# Patient Record
Sex: Female | Born: 1965 | Race: White | Hispanic: No | Marital: Married
Health system: Southern US, Community
[De-identification: ages and names within clinical notes are randomized; demographics above are authoritative.]

---

## 1998-01-15 ENCOUNTER — Other Ambulatory Visit: Admission: RE | Admit: 1998-01-15 | Discharge: 1998-01-15 | Payer: Self-pay | Admitting: Obstetrics and Gynecology

## 1998-08-03 ENCOUNTER — Inpatient Hospital Stay (HOSPITAL_COMMUNITY): Admission: AD | Admit: 1998-08-03 | Discharge: 1998-08-05 | Payer: Self-pay | Admitting: Obstetrics and Gynecology

## 1998-09-05 ENCOUNTER — Other Ambulatory Visit: Admission: RE | Admit: 1998-09-05 | Discharge: 1998-09-05 | Payer: Self-pay | Admitting: Obstetrics and Gynecology

## 1999-12-04 ENCOUNTER — Other Ambulatory Visit: Admission: RE | Admit: 1999-12-04 | Discharge: 1999-12-04 | Payer: Self-pay | Admitting: Obstetrics and Gynecology

## 2000-12-30 ENCOUNTER — Other Ambulatory Visit: Admission: RE | Admit: 2000-12-30 | Discharge: 2000-12-30 | Payer: Self-pay | Admitting: Obstetrics and Gynecology

## 2001-12-31 ENCOUNTER — Other Ambulatory Visit: Admission: RE | Admit: 2001-12-31 | Discharge: 2001-12-31 | Payer: Self-pay | Admitting: Obstetrics and Gynecology

## 2003-03-08 ENCOUNTER — Other Ambulatory Visit: Admission: RE | Admit: 2003-03-08 | Discharge: 2003-03-08 | Payer: Self-pay | Admitting: Obstetrics and Gynecology

## 2004-04-11 ENCOUNTER — Ambulatory Visit: Payer: Self-pay | Admitting: Family Medicine

## 2004-06-13 ENCOUNTER — Other Ambulatory Visit: Admission: RE | Admit: 2004-06-13 | Discharge: 2004-06-13 | Payer: Self-pay | Admitting: Obstetrics and Gynecology

## 2005-02-06 ENCOUNTER — Encounter: Admission: RE | Admit: 2005-02-06 | Discharge: 2005-02-06 | Payer: Self-pay | Admitting: Obstetrics and Gynecology

## 2005-03-26 ENCOUNTER — Other Ambulatory Visit: Admission: RE | Admit: 2005-03-26 | Discharge: 2005-03-26 | Payer: Self-pay | Admitting: Obstetrics and Gynecology

## 2006-05-19 ENCOUNTER — Encounter: Admission: RE | Admit: 2006-05-19 | Discharge: 2006-05-19 | Payer: Self-pay | Admitting: Obstetrics and Gynecology

## 2008-08-24 ENCOUNTER — Encounter: Admission: RE | Admit: 2008-08-24 | Discharge: 2008-08-24 | Payer: Self-pay | Admitting: Obstetrics and Gynecology

## 2008-09-05 ENCOUNTER — Ambulatory Visit (HOSPITAL_COMMUNITY): Admission: RE | Admit: 2008-09-05 | Discharge: 2008-09-05 | Payer: Self-pay | Admitting: Obstetrics and Gynecology

## 2009-09-03 ENCOUNTER — Encounter: Admission: RE | Admit: 2009-09-03 | Discharge: 2009-09-03 | Payer: Self-pay | Admitting: Obstetrics and Gynecology

## 2010-06-17 LAB — CBC
HCT: 34.5 % — ABNORMAL LOW (ref 36.0–46.0)
Hemoglobin: 12.3 g/dL (ref 12.0–15.0)
MCHC: 35.5 g/dL (ref 30.0–36.0)
MCV: 93.4 fL (ref 78.0–100.0)
Platelets: 222 10*3/uL (ref 150–400)
RBC: 3.7 MIL/uL — ABNORMAL LOW (ref 3.87–5.11)
RDW: 12.8 % (ref 11.5–15.5)
WBC: 4.9 10*3/uL (ref 4.0–10.5)

## 2010-06-17 LAB — PREGNANCY, URINE: Preg Test, Ur: NEGATIVE

## 2010-07-23 NOTE — Op Note (Signed)
NAME:  Sherri Rodriguez, Sherri Rodriguez           ACCOUNT NO.:  [id number]   MEDICAL RECORD NO.:  [id number]          PATIENT TYPE:  AMB   LOCATION:  SDC                           FACILITY:  [facility]   PHYSICIAN:  Zenaida Niece, M.D.DATE OF BIRTH:  12-11-1965   DATE OF PROCEDURE:  09/05/2008  DATE OF DISCHARGE:                               OPERATIVE REPORT   PREOPERATIVE DIAGNOSIS:  Dyspareunia.   POSTOPERATIVE DIAGNOSIS:  Dyspareunia.   PROCEDURE:  Perineoplasty.   SURGEON:  Zenaida Niece, MD   ANESTHESIA:  Local.   FINDINGS:  She had a narrow introitus.   SPECIMENS:  None.   ESTIMATED BLOOD LOSS:  Minimal.   COMPLICATIONS:  None.   PROCEDURE IN DETAIL:  The patient was taken into the operating room and  placed in the dorsal supine position.  She was then placed in mobile  stirrups.  Perineum and outer vagina were prepped and draped in the  usual sterile fashion.  A 0.5% Marcaine with epinephrine was infiltrated  at the posterior introitus.  Once this had taken effect, the hymenal  ring was grasped with Allis clamps just to the side of the midline.  A  vertical incision was then made with a scalpel that went approximately 1  cm inside the vagina and 1 cm outside the vagina.  This appeared to  achieve good opening of the introitus once this was approximated  horizontally.  This defect was then sutured horizontally with running  locking 3-0 Vicryl.  This achieved good closure and adequate hemostasis.  This achieved an extra 1-2 cm of length in the introitus.  The patient  tolerated the procedure well.  She was taken down from the stirrups and  taken to the recovery room in stable condition.  All counts were correct  and she received Ancef 1 g IV at the beginning of the procedure.      Zenaida Niece, M.D.  Electronically Signed     TDM/MEDQ  D:  09/05/2008  T:  09/05/2008  Job:  [id number]

## 2010-09-30 ENCOUNTER — Other Ambulatory Visit: Payer: Self-pay | Admitting: Obstetrics and Gynecology

## 2010-09-30 DIAGNOSIS — Z1231 Encounter for screening mammogram for malignant neoplasm of breast: Secondary | ICD-10-CM

## 2010-10-08 ENCOUNTER — Ambulatory Visit
Admission: RE | Admit: 2010-10-08 | Discharge: 2010-10-08 | Disposition: A | Discharge status: Home / Self Care | Payer: 59 | Source: Ambulatory Visit | Attending: Obstetrics and Gynecology | Admitting: Obstetrics and Gynecology

## 2010-10-08 DIAGNOSIS — Z1231 Encounter for screening mammogram for malignant neoplasm of breast: Secondary | ICD-10-CM

## 2011-11-18 ENCOUNTER — Other Ambulatory Visit: Payer: Self-pay | Admitting: Obstetrics and Gynecology

## 2011-11-18 DIAGNOSIS — Z1231 Encounter for screening mammogram for malignant neoplasm of breast: Secondary | ICD-10-CM

## 2011-12-01 ENCOUNTER — Ambulatory Visit: Payer: 59

## 2011-12-15 ENCOUNTER — Ambulatory Visit
Admission: RE | Admit: 2011-12-15 | Discharge: 2011-12-15 | Disposition: A | Discharge status: Home / Self Care | Payer: 59 | Source: Ambulatory Visit | Attending: Obstetrics and Gynecology | Admitting: Obstetrics and Gynecology

## 2011-12-15 DIAGNOSIS — Z1231 Encounter for screening mammogram for malignant neoplasm of breast: Secondary | ICD-10-CM

## 2013-01-18 ENCOUNTER — Other Ambulatory Visit: Payer: Self-pay

## 2013-01-18 DIAGNOSIS — Z1231 Encounter for screening mammogram for malignant neoplasm of breast: Secondary | ICD-10-CM

## 2013-02-18 ENCOUNTER — Ambulatory Visit
Admission: RE | Admit: 2013-02-18 | Discharge: 2013-02-18 | Disposition: A | Discharge status: Home / Self Care | Payer: 59 | Source: Ambulatory Visit

## 2013-02-18 DIAGNOSIS — Z1231 Encounter for screening mammogram for malignant neoplasm of breast: Secondary | ICD-10-CM

## 2014-04-11 ENCOUNTER — Other Ambulatory Visit: Payer: Self-pay

## 2014-04-11 DIAGNOSIS — Z1231 Encounter for screening mammogram for malignant neoplasm of breast: Secondary | ICD-10-CM

## 2014-04-18 ENCOUNTER — Ambulatory Visit
Admission: RE | Admit: 2014-04-18 | Discharge: 2014-04-18 | Disposition: A | Discharge status: Home / Self Care | Payer: 59 | Source: Ambulatory Visit

## 2014-04-18 ENCOUNTER — Encounter (INDEPENDENT_AMBULATORY_CARE_PROVIDER_SITE_OTHER): Payer: Self-pay

## 2014-04-18 DIAGNOSIS — Z1231 Encounter for screening mammogram for malignant neoplasm of breast: Secondary | ICD-10-CM

## 2015-04-02 ENCOUNTER — Other Ambulatory Visit: Payer: Self-pay

## 2015-04-02 DIAGNOSIS — Z1231 Encounter for screening mammogram for malignant neoplasm of breast: Secondary | ICD-10-CM

## 2015-04-26 ENCOUNTER — Ambulatory Visit
Admission: RE | Admit: 2015-04-26 | Discharge: 2015-04-26 | Disposition: A | Discharge status: Home / Self Care | Payer: Commercial Managed Care - HMO | Source: Ambulatory Visit

## 2015-04-26 DIAGNOSIS — Z1231 Encounter for screening mammogram for malignant neoplasm of breast: Secondary | ICD-10-CM

## 2015-05-31 ENCOUNTER — Other Ambulatory Visit: Payer: Self-pay | Admitting: Family Medicine

## 2015-05-31 DIAGNOSIS — R634 Abnormal weight loss: Secondary | ICD-10-CM

## 2015-06-06 ENCOUNTER — Ambulatory Visit
Admission: RE | Admit: 2015-06-06 | Discharge: 2015-06-06 | Disposition: A | Discharge status: Home / Self Care | Payer: Commercial Managed Care - HMO | Source: Ambulatory Visit | Attending: Family Medicine | Admitting: Family Medicine

## 2015-06-06 DIAGNOSIS — R634 Abnormal weight loss: Secondary | ICD-10-CM

## 2015-06-06 IMAGING — CT CT ABD-PELV W/ CM
2 of 5 series · 16 of 46 positions shown, 18 images · IV contrast (APPLIED)
Comparison: None.

CLINICAL DATA: Abnormal weight loss over the past 2 months.

EXAM:
CT ABDOMEN AND PELVIS WITH CONTRAST
TECHNIQUE: Multidetector CT imaging of the abdomen and pelvis was performed
using the standard protocol following bolus administration of
intravenous contrast.
CONTRAST:  100mL [IW] IOPAMIDOL ([IW]) INJECTION 61%

[Series 2: abd/pelvis w/cm · axial · 0.59mm/px · z∈[+718,+1063]mm · 13 of 79 slices shown, 15 images]
[im 5/79  soft-tissue]
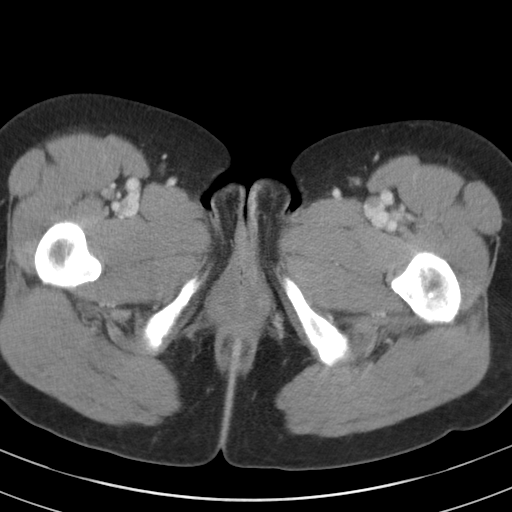
[im 5/79  bone]
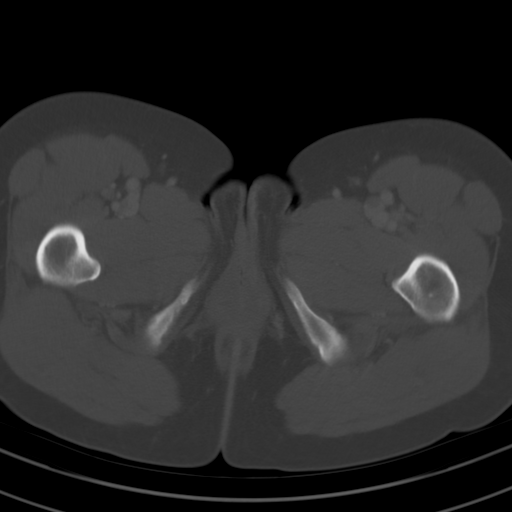
[im 13/79  soft-tissue]
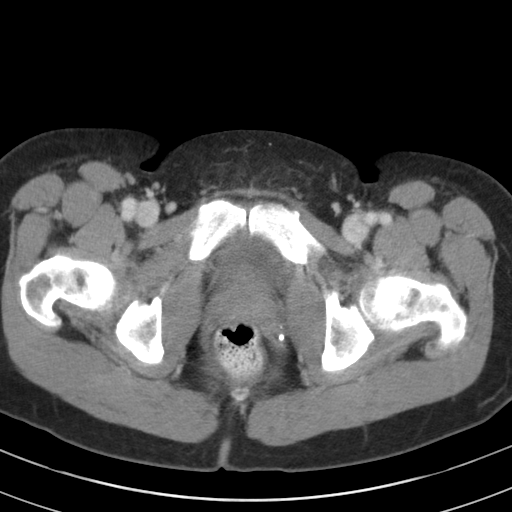
[im 17/79  soft-tissue]
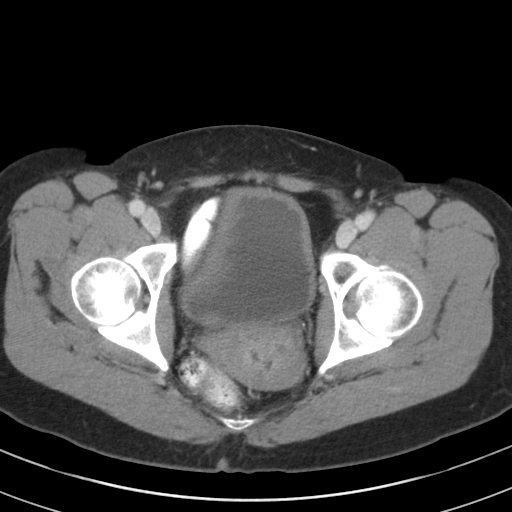
[im 21/79  soft-tissue]
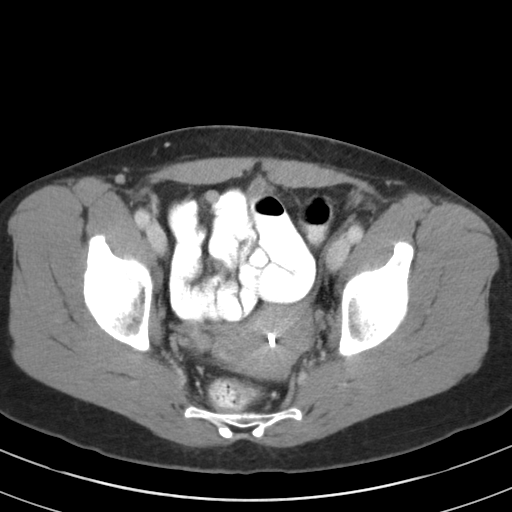
[im 29/79  soft-tissue]
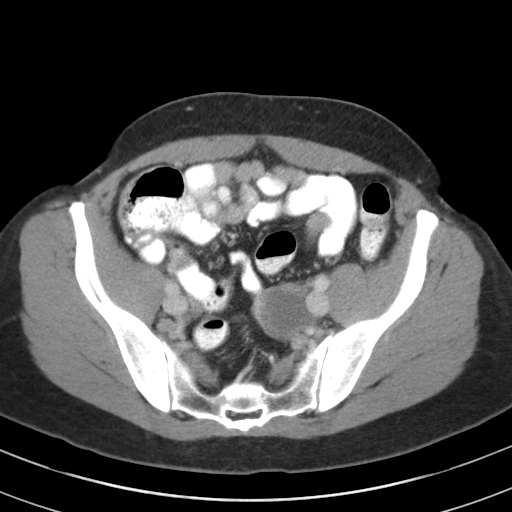
[im 33/79  soft-tissue]
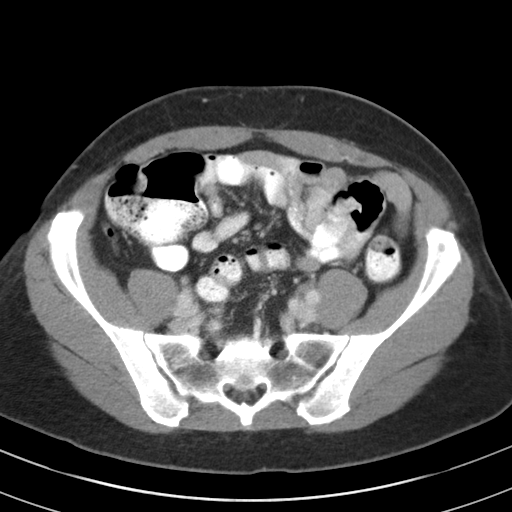
[im 42/79  soft-tissue]
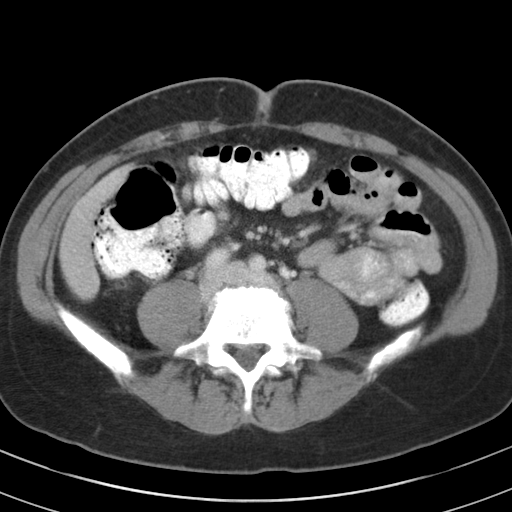
[im 46/79  soft-tissue]
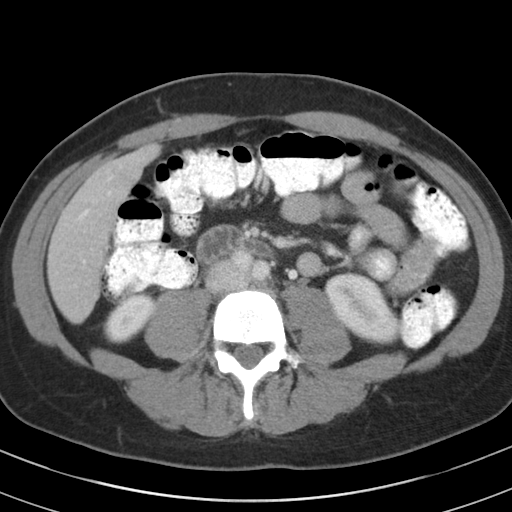
[im 50/79  soft-tissue]
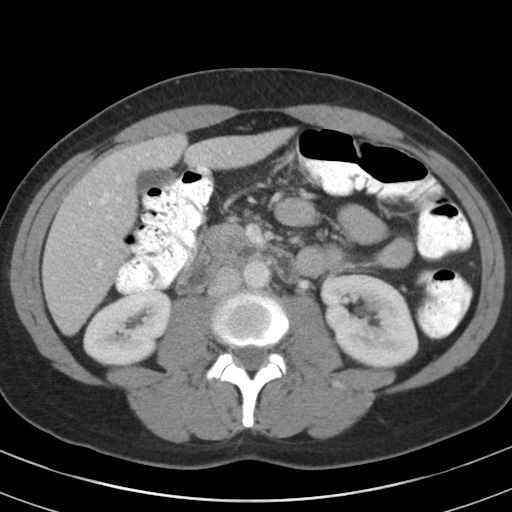
[im 50/79  bone]
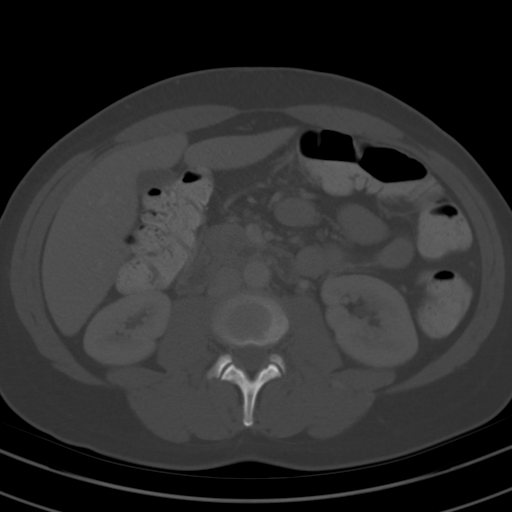
[im 58/79  soft-tissue]
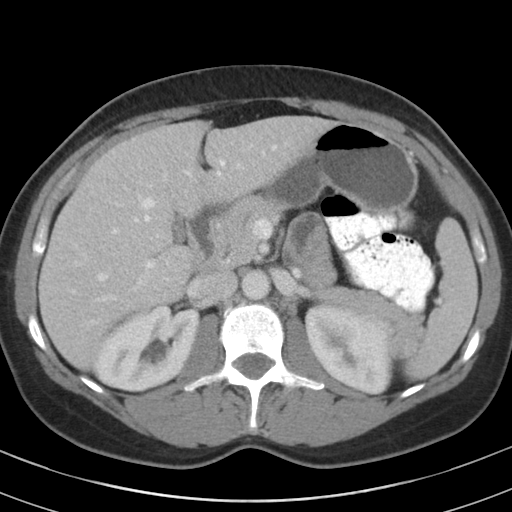
[im 62/79  soft-tissue]
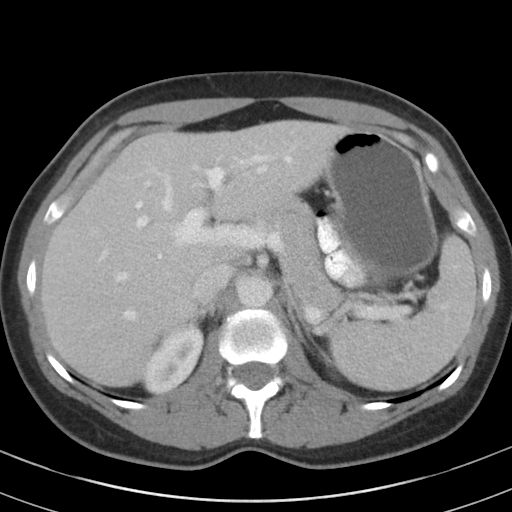
[im 66/79  soft-tissue]
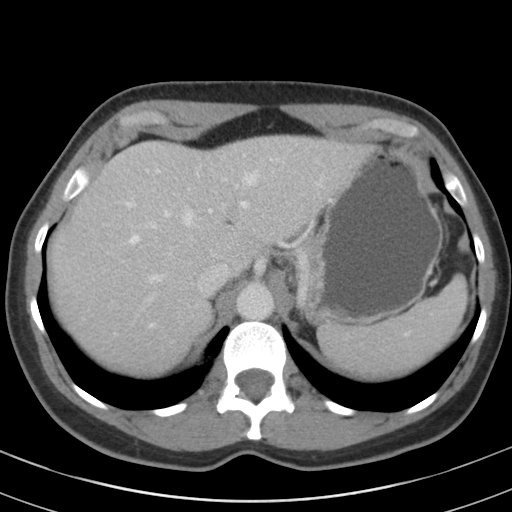
[im 74/79  soft-tissue]
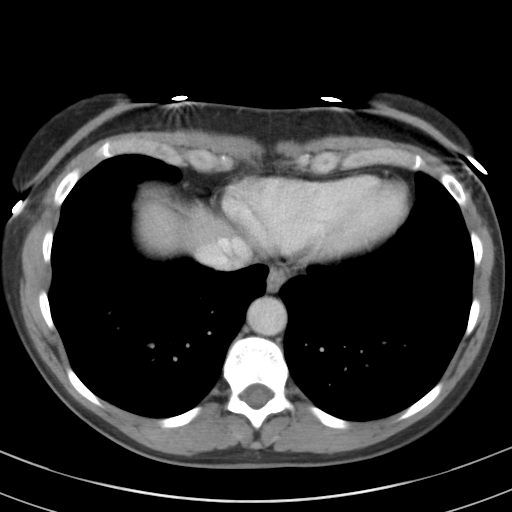

[Series 3: cor · coronal · 0.57mm/px · 3 of 81 slices shown]
[im 27/81  soft-tissue]
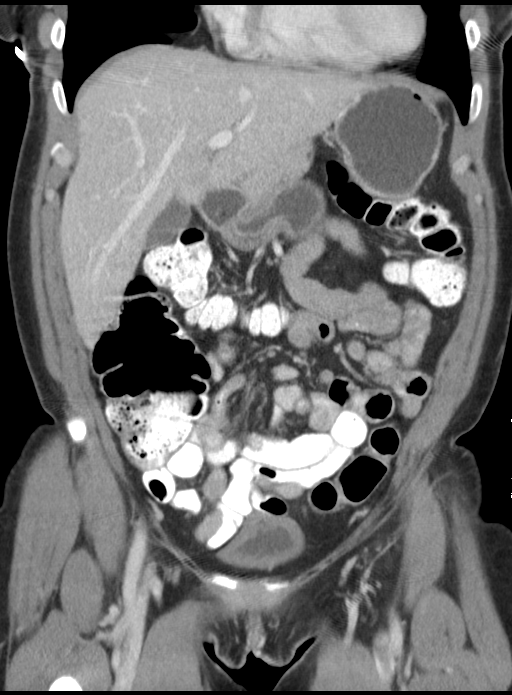
[im 36/81  soft-tissue]
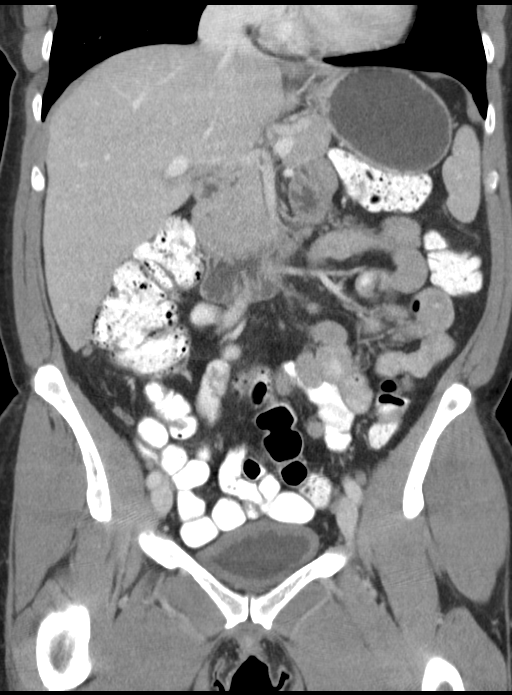
[im 45/81  soft-tissue]
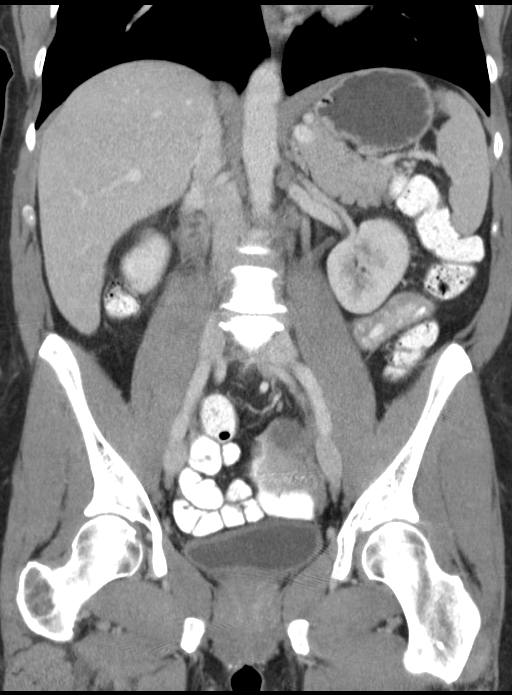

[16 of 46 positions shown; findings below may reference images not displayed]

FINDINGS: Lower chest: The lung bases are clear of acute process. No pleural
effusion or pulmonary lesions. The heart is normal in size. No
pericardial effusion. Pectus deformity slight flattening of the
heart. The distal esophagus and aorta are unremarkable.

Hepatobiliary: No focal hepatic lesions or intrahepatic biliary
dilatation. The gallbladder is normal. No common bile duct
dilatation.

Pancreas: No mass, inflammation or ductal dilatation.

Spleen: Normal size.  No focal lesions.

Adrenals/Urinary Tract: The adrenal glands and kidneys are normal.

Stomach/Bowel: The stomach, duodenum, small bowel and colon are
normal. No inflammatory changes, mass lesions or obstructive
findings. The terminal ileum is normal. The appendix is normal.

Vascular/Lymphatic: No mesenteric or retroperitoneal mass are
adenopathy. The aorta is normal in caliber. The branch vessels are
normal. The major venous structures are patent.

Reproductive: The uterus and ovaries are normal except for several
nabothian cysts at the cervix. An IUD is noted in the endometrial
canal. Simple appearing left ovarian cyst.

Other: Mild bladder wall thickening likely due to lack of
distention. Cystitis is also possible. No pelvic mass or adenopathy.
No free pelvic fluid collections. No inguinal mass or adenopathy. No
abdominal wall hernia or subcutaneous lesions.

Musculoskeletal: No significant bony findings. Moderate degenerative
disc disease at L4-5.
IMPRESSION: No acute abdominal/pelvic findings, mass lesions or lymphadenopathy.

## 2015-06-06 MED ORDER — IOPAMIDOL (ISOVUE-300) INJECTION 61%
100.0000 mL | Freq: Once | INTRAVENOUS | Status: AC | PRN
  Administered 2015-06-06: 100 mL via INTRAVENOUS

## 2015-08-27 ENCOUNTER — Other Ambulatory Visit: Payer: Self-pay | Admitting: Physician Assistant

## 2015-08-27 DIAGNOSIS — R0989 Other specified symptoms and signs involving the circulatory and respiratory systems: Secondary | ICD-10-CM

## 2015-08-27 DIAGNOSIS — R1313 Dysphagia, pharyngeal phase: Secondary | ICD-10-CM

## 2015-08-31 ENCOUNTER — Ambulatory Visit
Admission: RE | Admit: 2015-08-31 | Discharge: 2015-08-31 | Disposition: A | Discharge status: Home / Self Care | Payer: Commercial Managed Care - HMO | Source: Ambulatory Visit | Attending: Physician Assistant | Admitting: Physician Assistant

## 2015-08-31 DIAGNOSIS — R1313 Dysphagia, pharyngeal phase: Secondary | ICD-10-CM

## 2015-08-31 DIAGNOSIS — R0989 Other specified symptoms and signs involving the circulatory and respiratory systems: Secondary | ICD-10-CM

## 2015-08-31 IMAGING — RF DG ESOPHAGUS
19 of 24 series · 19 of 24 positions shown · non-contrast
Comparison: None.

CLINICAL DATA: Globus sensation with pharyngeal dysphagia

EXAM:
ESOPHOGRAM / BARIUM SWALLOW / BARIUM TABLET STUDY
TECHNIQUE: Combined double contrast and single contrast examination performed
using effervescent crystals, thick barium liquid, and thin barium
liquid. The patient was observed with fluoroscopy swallowing a 13 mm
barium sulphate tablet.
FLUOROSCOPY TIME:  Radiation Exposure Index (as provided by the
fluoroscopic device):
If the device does not provide the exposure index:
Fluoroscopy Time:  1 minutes and 42 seconds.
Number of Acquired Images:

[Series 1: run · 1 of 8 slices shown (1 of 19)]
[im 1/8]
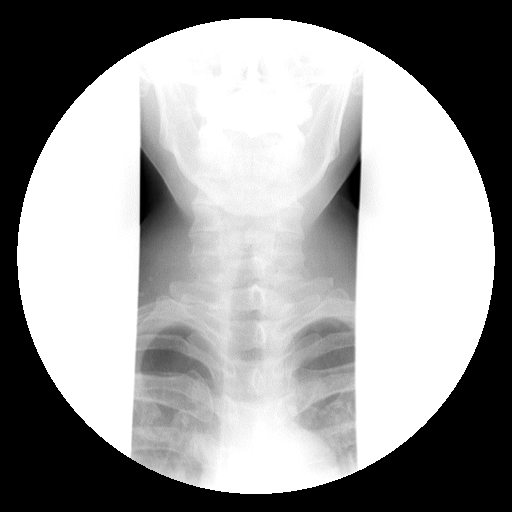

[Series 2: run · 1 of 8 slices shown (2 of 19)]
[im 1/8]
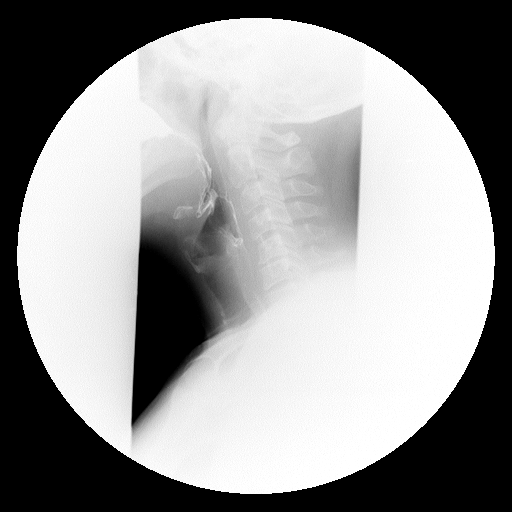

[Series 4: run · 1 of 1 slices shown (3 of 19)]
[im 1/1]
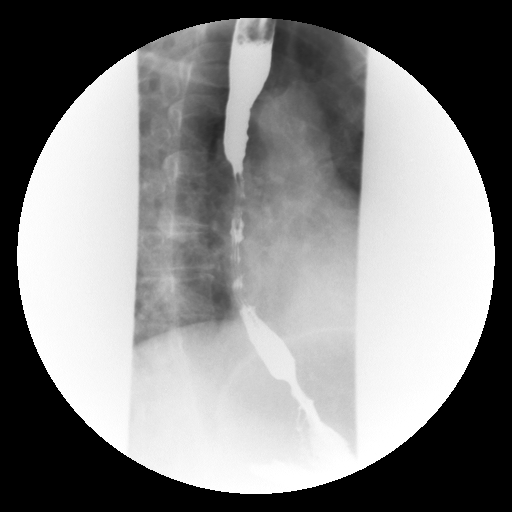

[Series 5: run · 1 of 1 slices shown (4 of 19)]
[im 1/1]
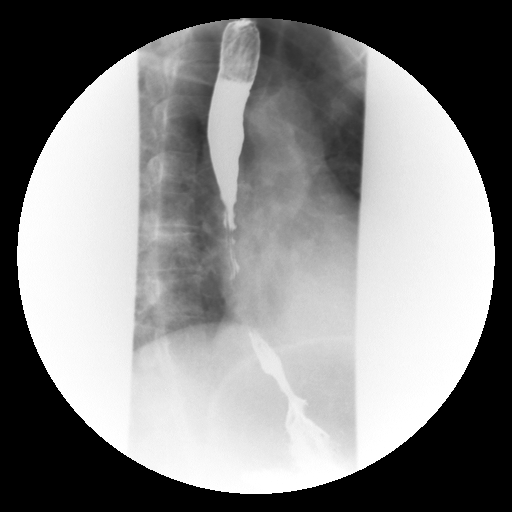

[Series 6: run · 1 of 1 slices shown (5 of 19)]
[im 1/1]
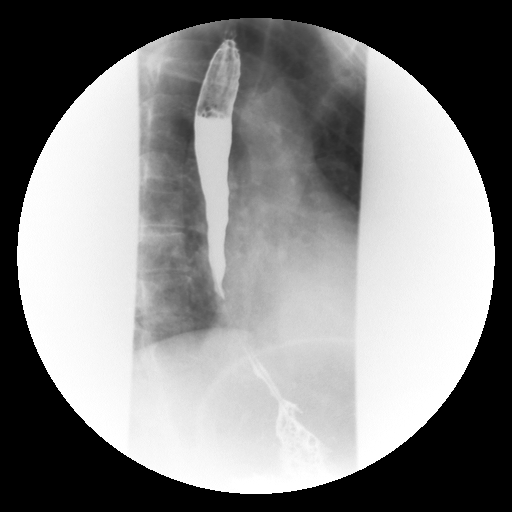

[Series 7: run · 1 of 1 slices shown (6 of 19)]
[im 1/1]
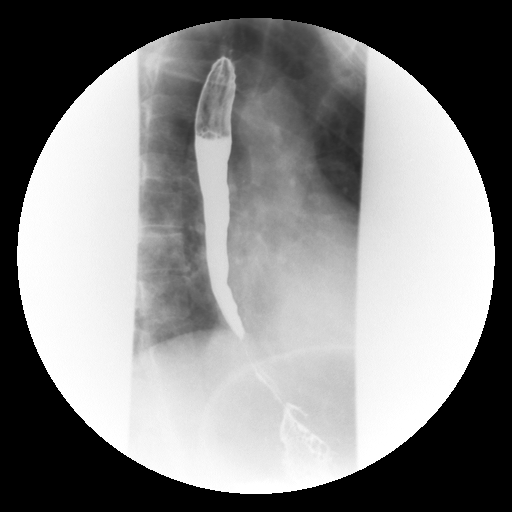

[Series 9: run · 1 of 1 slices shown (7 of 19)]
[im 1/1]
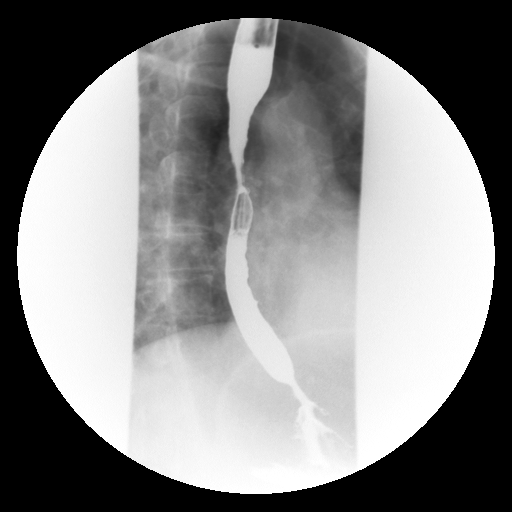

[Series 10: run · 1 of 1 slices shown (8 of 19)]
[im 1/1]
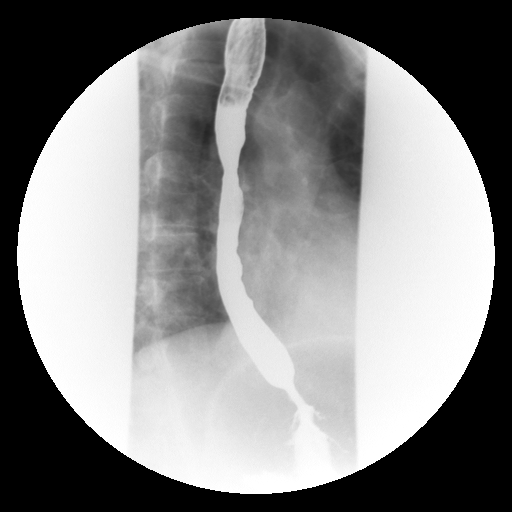

[Series 11: run · 1 of 1 slices shown (9 of 19)]
[im 1/1]
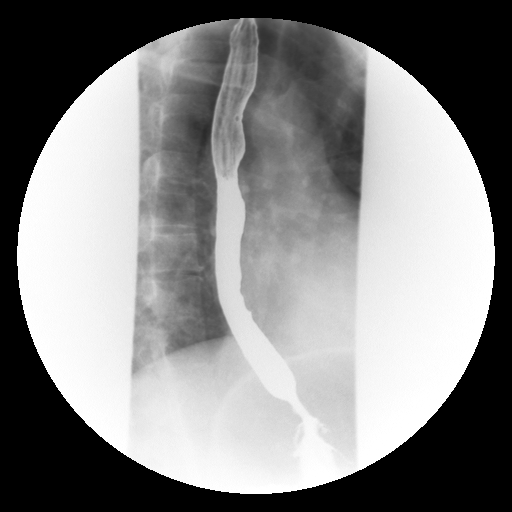

[Series 13: run · 1 of 1 slices shown (10 of 19)]
[im 1/1]
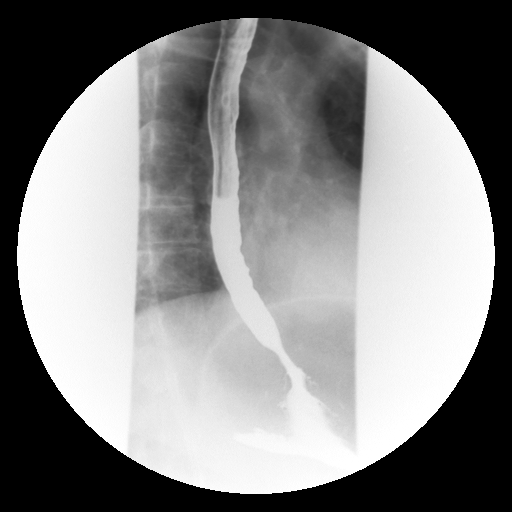

[Series 14: run · 1 of 1 slices shown (11 of 19)]
[im 1/1]
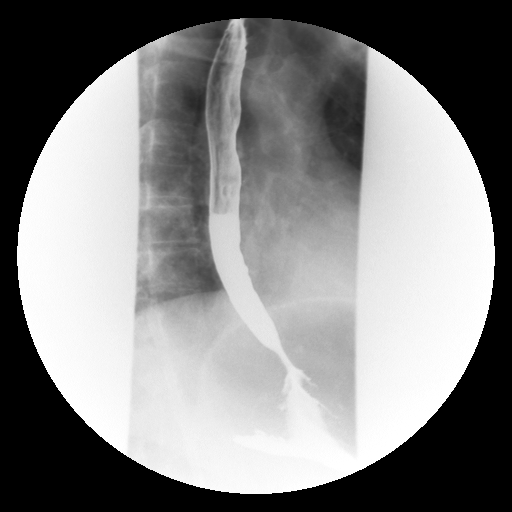

[Series 15: run · 1 of 1 slices shown (12 of 19)]
[im 1/1]
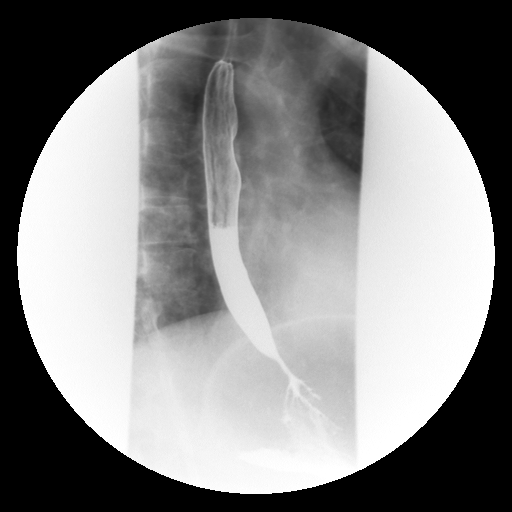

[Series 16: run · 1 of 1 slices shown (13 of 19)]
[im 1/1]
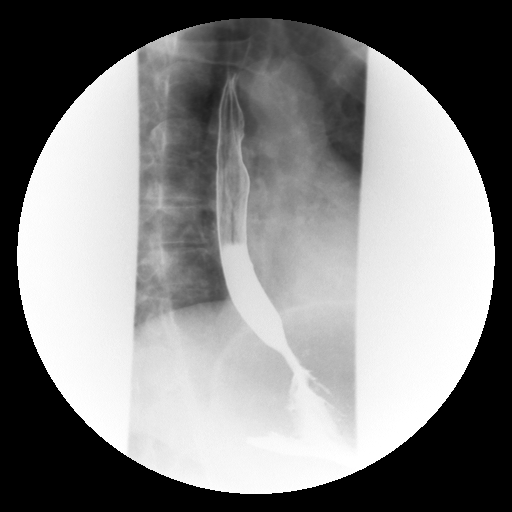

[Series 18: run · 1 of 3 slices shown (14 of 19)]
[im 1/3]
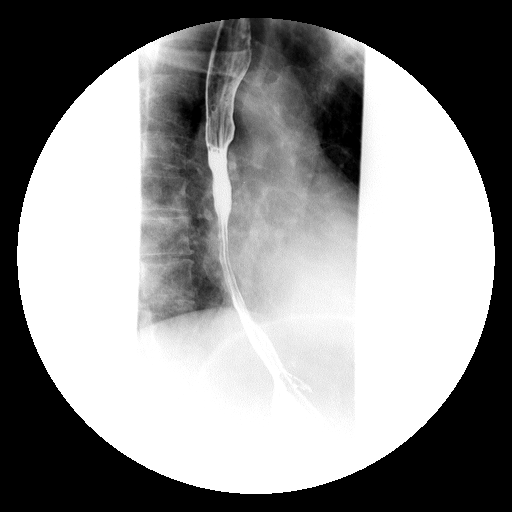

[Series 19: run · 1 of 1 slices shown (15 of 19)]
[im 1/1]
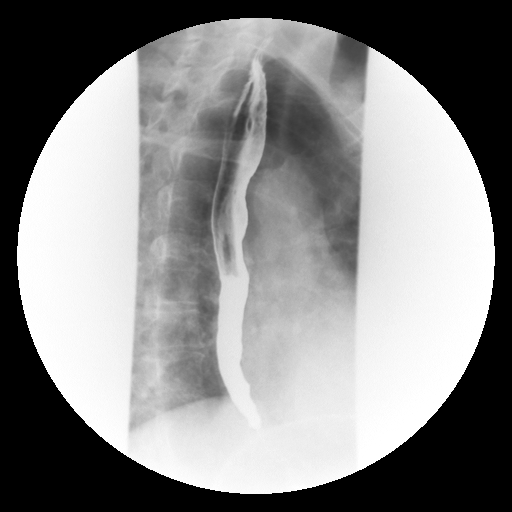

[Series 20: run · 1 of 5 slices shown (16 of 19)]
[im 1/5]
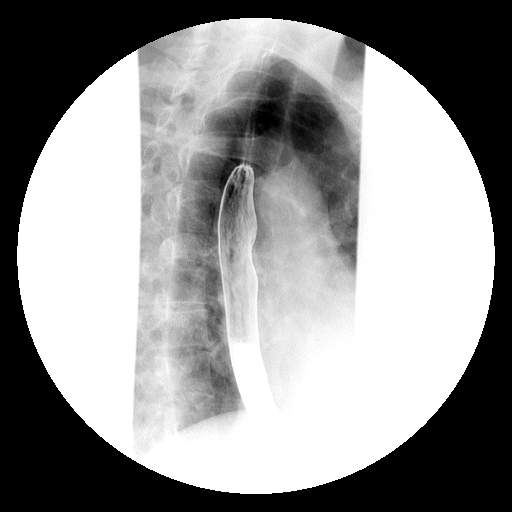

[Series 21: run · 1 of 1 slices shown (17 of 19)]
[im 1/1]
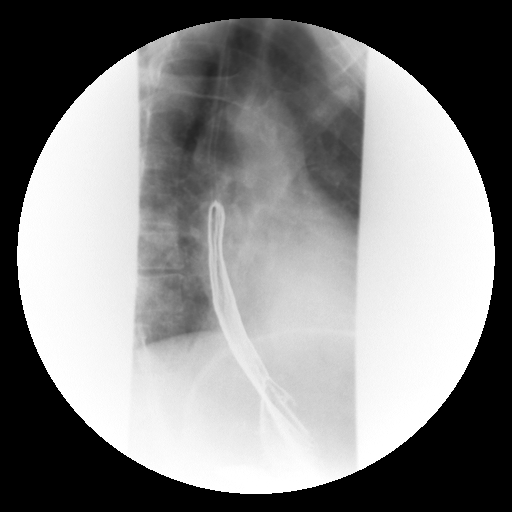

[Series 23: run · 1 of 1 slices shown (18 of 19)]
[im 1/1]
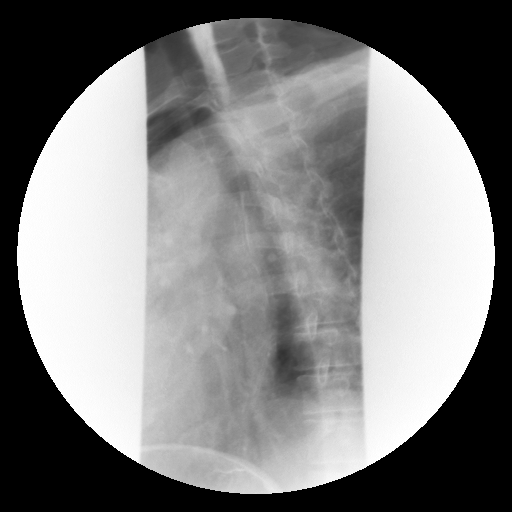

[Series 24: run · 1 of 1 slices shown (19 of 19)]
[im 1/1]
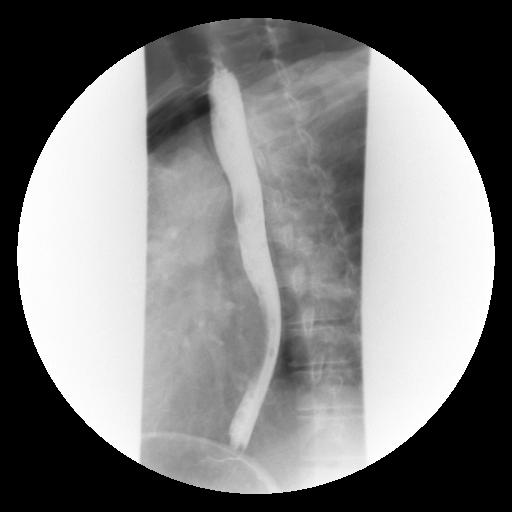

[19 of 24 positions shown; findings below may reference images not displayed]

FINDINGS: Frontal and lateral views of the hypopharynx while swallowing are
normal.

Double contrast imaging of the esophagus shows no evidence for
esophageal stricture or mass lesion. No esophageal diverticulum. No
evidence for mucosal ulceration.

Esophageal motility is normal with good preservation of primary
peristalsis on all swallows. Mucosal relief view shows no evidence
for esophageal fold thickening is suggest esophagitis.

13 mm barium tablet passes readily into the stomach when taken with
water.
IMPRESSION: Normal double contrast barium esophagram.

## 2018-12-22 ENCOUNTER — Other Ambulatory Visit: Payer: Self-pay

## 2018-12-22 DIAGNOSIS — Z20822 Contact with and (suspected) exposure to covid-19: Secondary | ICD-10-CM

## 2018-12-23 LAB — NOVEL CORONAVIRUS, NAA: SARS-CoV-2, NAA: NOT DETECTED

## 2019-03-10 ENCOUNTER — Other Ambulatory Visit: Payer: Self-pay

## 2019-03-10 ENCOUNTER — Encounter: Payer: Self-pay | Admitting: Physical Therapy

## 2019-03-10 ENCOUNTER — Ambulatory Visit: Payer: Commercial Managed Care - PPO | Attending: Family Medicine | Admitting: Physical Therapy

## 2019-03-10 DIAGNOSIS — M25652 Stiffness of left hip, not elsewhere classified: Secondary | ICD-10-CM | POA: Diagnosis present

## 2019-03-10 DIAGNOSIS — M25552 Pain in left hip: Secondary | ICD-10-CM | POA: Diagnosis not present

## 2019-03-10 NOTE — Therapy (Signed)
[facility] [address], [place], [place], [address] Phone: [phone]   Fax:  [phone]  Physical Therapy Evaluation  Patient Details  Name: Sherri Rodriguez MRN: [id number] Date of Birth: 1965-09-23 Referring Provider (PT): Jillyn Ledger, FNP   Encounter Date: 03/10/2019  PT End of Session - 03/10/19 1154    Visit Number  1    Date for PT Re-Evaluation  04/22/19    Authorization Type  [employer] PPO    Authorization Time Period  03/10/19 to 04/22/19    PT Start Time  1016    PT Stop Time  1058    PT Time Calculation (min)  42 min    Activity Tolerance  No increased pain;Patient tolerated treatment well    Behavior During Therapy  [facility] for tasks assessed/performed       History reviewed. No pertinent past medical history.  History reviewed. No pertinent surgical history.  There were no vitals filed for this visit.   Subjective Assessment - 03/10/19 1020    Subjective  Pt states that she went skydiving at the end of November, but noticed a tweak in her Lt anterior hip while playing pickleball the following Wednesday. She has noticed it becoming increasingly worse over the past weeks, and she is now noticing pain daily.    Pertinent History  none    Diagnostic tests  Xray: negative for pathology    Patient Stated Goals  decrease pain with activity    Currently in Pain?  Yes    Pain Score  2     Pain Location  Hip    Pain Orientation  Left;Anterior    Pain Descriptors / Indicators  Aching    Pain Type  Acute pain    Pain Radiating Towards  sometimes pain lateral thigh    Pain Onset  1 to 4 weeks ago    Pain Frequency  Intermittent    Aggravating Factors   Lt hip flexion, playing pickleball/tennis towards the end    Pain Relieving Factors  tighening lower abs helps take stress off of it         [facility] PT Assessment - 03/10/19 0001      Assessment   Medical Diagnosis  Pain in left hip     Referring  Provider (PT)  Jillyn Ledger, FNP    Onset Date/Surgical Date  --   Feb 09, 2019   Next MD Visit  none     Prior Therapy  none       Precautions   Precautions  None      Restrictions   Weight Bearing Restrictions  No      Balance Screen   Has the patient fallen in the past 6 months  No    Has the patient had a decrease in activity level because of a fear of falling?   No    Is the patient reluctant to leave their home because of a fear of falling?   No      Prior Function   Leisure  plays pickleball/tennis throughout the week; recently began a couch to 5k       Cognition   Overall Cognitive Status  Within Functional Limits for tasks assessed      Sensation   Additional Comments  denies numbness/tingling       Functional Tests   Functional tests  Single Leg Squat;Squat;Single leg stance      Squat   Comments  BLE:  weight shift Rt (+) pain Lt anterior hip      Single Leg Squat   Comments  x8 reps (+) knee valgus noted on Lt       Single Leg Stance   Comments  10+ sec, no pain       ROM / Strength   AROM / PROM / Strength  Strength;AROM      AROM   Overall AROM Comments  Active lumbar flexion, rotation, extension full and pain free; Lt hip: IR 20 deg (+) pain; Rt IR 40 deg pain free       Strength   Strength Assessment Site  Hip;Knee    Right/Left Hip  Right;Left    Right Hip Flexion  5/5    Right Hip Extension  5/5    Right Hip ABduction  4/5    Left Hip Flexion  4/5   (+) pain   Left Hip Extension  4/5    Left Hip External Rotation  4/5    Left Hip Internal Rotation  4/5    Left Hip ABduction  4/5      Palpation   Palpation comment  tenderness Lt ITB, Lt iliacus                Objective measurements completed on examination: See above findings.      [facility] Adult PT Treatment/Exercise - 03/10/19 0001      Exercises   Exercises  Knee/Hip      Knee/Hip Exercises: Stretches   Other Knee/Hip Stretches  long sitting hip IR/ER knee fallout x3  reps, HEP demo              PT Education - 03/10/19 1153    Education Details  seated hip IR/ER fallout for home; trial hip flexor yoga at home    Person(s) Educated  Patient    Methods  Explanation;Verbal cues    Comprehension  Verbalized understanding;Returned demonstration       PT Short Term Goals - 03/10/19 1106      PT SHORT TERM GOAL #1   Title  Pt will have increased Lt hip passive internal rotation and external rotation greater than 30 deg without pain.    Time  3    Period  Weeks    Status  New    Target Date  03/31/19      PT SHORT TERM GOAL #2   Title  Pt will be independent and consistent with her intial HEP.    Time  6    Period  Weeks    Status  New        PT Long Term Goals - 03/10/19 1112      PT LONG TERM GOAL #1   Title  Pt will have 5/5 MMT strength of the Lt hip which will allow her to safely resume tennis/pickleball participation.    Time  6    Period  Weeks    Status  New    Target Date  04/22/19      PT LONG TERM GOAL #2   Title  Pt will report atleast 60% improvement in her pain from the start of PT.    Time  6    Period  Weeks    Status  New      PT LONG TERM GOAL #3   Title  Pt will be able to complete BLE squat x10 reps without weight shift to the Rt.    Time  6  Period  Weeks    Status  New      PT LONG TERM GOAL #4   Title  Pt will be able to complete single leg squat with UE support x5 reps without knee valgus deviation on the Lt, to reflect improved single leg stability.    Time  6    Period  Weeks    Status  New      PT LONG TERM GOAL #5   Title  Pt will be able to return to her running program without exacerbation of Lt hip pain.    Time  6    Period  Weeks    Status  New             Plan - 03/10/19 1252    Clinical Impression Statement  Pt is a 53 y.o F referred to OPPT with complaints of acute Lt anterior hip pain. She is active, playing pickleball and tennis throughout the week. She has also recently  started a running program. Pain was onset beginning of December, noted during a pickleball game several days after skydiving, and it has gradually worsened since then. Pt has palpable tenderness of the Lt TFL/iliacus and pain/weakness with resisted hip flexion. There is noted restrictions in Lt hip rotation compared to the Rt and this is evident during functional activity such as bilateral LE squatting and single leg squatting. Pt would benefit from skilled PT to address her restrictions in hip strength, flexibility and neuromuscular control to promote return to her active lifestyle without limitation.    Personal Factors and Comorbidities  Time since onset of injury/illness/exacerbation;Fitness    Examination-Activity Limitations  Squat    Examination-Participation Restrictions  Other   recreation   Stability/Clinical Decision Making  Stable/Uncomplicated    Clinical Decision Making  Low    Rehab Potential  Good    PT Frequency  Other (comment)   1-2x/week as needed   PT Duration  6 weeks    PT Treatment/Interventions  ADLs/Self Care Home Management;Cryotherapy;Moist Heat;Stair training;Neuromuscular re-education;Balance training;Therapeutic exercise;Therapeutic activities;Patient/family education;Manual techniques;Dry needling;Passive range of motion;Taping    PT Next Visit Plan  f/u on hip flexor yoga/IR-ER stretch; implement HEP: glute strength and hip rotation strength; Lt hip mobilization as needed    PT Home Exercise Plan  next visit    Consulted and Agree with Plan of Care  Patient       Patient will benefit from skilled therapeutic intervention in order to improve the following deficits and impairments:  Decreased activity tolerance, Improper body mechanics, Pain, Increased muscle spasms, Hypomobility, Decreased strength, Decreased range of motion, Impaired flexibility  Visit Diagnosis: Pain in left hip  Stiffness of left hip, not elsewhere classified     Problem List There are  no problems to display for this patient.   1:26 PM,03/10/19 Donita BrooksSara Andreya Lacks PT, DPT [facility]  [phone]  [facility] [address], [address] [place], [place], [address] Phone: [phone]   Fax:  [phone]  Name: Maretta LosSamantha G Bignell MRN: [id number] Date of Birth: [birth year]

## 2019-03-15 ENCOUNTER — Ambulatory Visit: Payer: Commercial Managed Care - PPO | Attending: Family Medicine | Admitting: Physical Therapy

## 2019-03-15 ENCOUNTER — Other Ambulatory Visit: Payer: Self-pay

## 2019-03-15 ENCOUNTER — Encounter: Payer: Self-pay | Admitting: Physical Therapy

## 2019-03-15 DIAGNOSIS — M25552 Pain in left hip: Secondary | ICD-10-CM | POA: Diagnosis present

## 2019-03-15 DIAGNOSIS — M25652 Stiffness of left hip, not elsewhere classified: Secondary | ICD-10-CM | POA: Diagnosis present

## 2019-03-15 NOTE — Patient Instructions (Signed)
Access Code: [email or web address]  URL: [email or web address]  Date: 03/15/2019  Prepared by: Donita Brooks    Exercises Side Plank on Knees- 5 reps- 2-3 sets- 10 hold- 1x daily- 7x weekly  Marching Bridge- 10 reps- 2-3 sets- 1x daily- 7x weekly  Standing Hip Extension with Resistance- 10 reps- 2-3 sets- 1x daily- 7x weekly    [facility] [address] [place], [place] [address] Phone # [phone] Fax [phone]

## 2019-03-15 NOTE — Therapy (Signed)
[facility] [address], [address] [place], [place], [address] Phone: [phone]   Fax:  [phone]  Physical Therapy Treatment  Patient Details  Name: Sherri Rodriguez MRN: [id number] Date of Birth: 09/11/1965 Referring Provider (PT): Peri Maris, FNP   Encounter Date: 03/15/2019  PT End of Session - 03/15/19 1228    Visit Number  2    Date for PT Re-Evaluation  04/22/19    Authorization Type  [employer] PPO    Authorization Time Period  03/10/19 to 04/22/19    PT Start Time  1150    PT Stop Time  1228    PT Time Calculation (min)  38 min    Activity Tolerance  No increased pain;Patient tolerated treatment well    Behavior During Therapy  [facility] for tasks assessed/performed       History reviewed. No pertinent past medical history.  History reviewed. No pertinent surgical history.  There were no vitals filed for this visit.  Subjective Assessment - 03/15/19 1400    Subjective  Pt states that her hip is still bothering her. She did work on her stretching and yoga over the week.    Pertinent History  none    Diagnostic tests  Xray: negative for pathology    Patient Stated Goals  decrease pain with activity    Currently in Pain?  No/denies    Pain Onset  1 to 4 weeks ago                       [facility] Adult PT Treatment/Exercise - 03/15/19 0001      Knee/Hip Exercises: Standing   Hip Extension  Right;Stengthening;Left;1 set;10 reps    Extension Limitations  red TB     Other Standing Knee Exercises  active hip IR/ER without overpressure with LE on 2nd step x15 reps       Knee/Hip Exercises: Supine   Animal nutritionist;Both    Bridges Limitations  hold with alternating LE march 2x8 reps each       Knee/Hip Exercises: Sidelying   Other Sidelying Knee/Hip Exercises  side plank on knees/elbow 5x5 sec hold each side       Manual Therapy   Manual therapy comments  STM Lt quadriceps, Lt TFL with trigger  point release Lt quadriceps             PT Education - 03/15/19 1228    Education Details  updated HEP; sleeping positions    Person(s) Educated  Patient    Methods  Explanation;Verbal cues;Handout    Comprehension  Verbalized understanding;Returned demonstration       PT Short Term Goals - 03/10/19 1106      PT SHORT TERM GOAL #1   Title  Pt will have increased Lt hip passive internal rotation and external rotation greater than 30 deg without pain.    Time  3    Period  Weeks    Status  New    Target Date  03/31/19      PT SHORT TERM GOAL #2   Title  Pt will be independent and consistent with her intial HEP.    Time  6    Period  Weeks    Status  New        PT Long Term Goals - 03/10/19 1112      PT LONG TERM GOAL #1   Title  Pt will have 5/5 MMT strength of the Lt  hip which will allow her to safely resume tennis/pickleball participation.    Time  6    Period  Weeks    Status  New    Target Date  04/22/19      PT LONG TERM GOAL #2   Title  Pt will report atleast 60% improvement in her pain from the start of PT.    Time  6    Period  Weeks    Status  New      PT LONG TERM GOAL #3   Title  Pt will be able to complete BLE squat x10 reps without weight shift to the Rt.    Time  6    Period  Weeks    Status  New      PT LONG TERM GOAL #4   Title  Pt will be able to complete single leg squat with UE support x5 reps without knee valgus deviation on the Lt, to reflect improved single leg stability.    Time  6    Period  Weeks    Status  New      PT LONG TERM GOAL #5   Title  Pt will be able to return to her running program without exacerbation of Lt hip pain.    Time  6    Period  Weeks    Status  New            Plan - 03/15/19 1244    Clinical Impression Statement  Pt has been working on her home stretching since the evaluation. As expected, there has been minimal change in her Lt hip pain throughout the day. Today's session focused on education  regarding proper sleep positions to avoid strain on the Lt hip. PT completed soft tissue mobilization the Lt quadriceps/glute medius/TFL to decrease trigger points in the area. Pt had good understanding of her HEP implemented during today's session and reported decrease in hip discomfort end of session.    Personal Factors and Comorbidities  Time since onset of injury/illness/exacerbation;Fitness    Examination-Activity Limitations  Squat    Examination-Participation Restrictions  Other   recreation   Stability/Clinical Decision Making  Stable/Uncomplicated    Rehab Potential  Good    PT Frequency  Other (comment)   1-2x/week as needed   PT Duration  6 weeks    PT Treatment/Interventions  ADLs/Self Care Home Management;Cryotherapy;Moist Heat;Stair training;Neuromuscular re-education;Balance training;Therapeutic exercise;Therapeutic activities;Patient/family education;Manual techniques;Dry needling;Passive range of motion;Taping    PT Next Visit Plan  d/u on HEP; glute strength and hip rotation strength    PT Home Exercise Plan  [email or web address]    Consulted and Agree with Plan of Care  Patient       Patient will benefit from skilled therapeutic intervention in order to improve the following deficits and impairments:  Decreased activity tolerance, Improper body mechanics, Pain, Increased muscle spasms, Hypomobility, Decreased strength, Decreased range of motion, Impaired flexibility  Visit Diagnosis: Pain in left hip  Stiffness of left hip, not elsewhere classified     Problem List There are no problems to display for this patient.   Sherol Dade 03/15/2019, 2:01 PM  [facility] [address] [place], [place], [address] Phone: [phone]   Fax:  [phone]  Name: LEIYA KEESEY MRN: [id number] Date of Birth: 1965/04/17

## 2019-03-22 ENCOUNTER — Encounter: Payer: Self-pay | Admitting: Physical Therapy

## 2019-03-22 ENCOUNTER — Ambulatory Visit: Payer: Commercial Managed Care - PPO | Admitting: Physical Therapy

## 2019-03-22 ENCOUNTER — Other Ambulatory Visit: Payer: Self-pay

## 2019-03-22 DIAGNOSIS — M25652 Stiffness of left hip, not elsewhere classified: Secondary | ICD-10-CM

## 2019-03-22 DIAGNOSIS — M25552 Pain in left hip: Secondary | ICD-10-CM

## 2019-03-22 NOTE — Therapy (Signed)
[facility] [address], [address] [place], [place], [address] Phone: [phone]   Fax:  [phone]  Physical Therapy Treatment  Patient Details  Name: Sherri Rodriguez MRN: [id number] Date of Birth: 1965-08-20 Referring Provider (PT): Peri Maris, FNP   Encounter Date: 03/22/2019  PT End of Session - 03/22/19 1528    Visit Number  3    Date for PT Re-Evaluation  04/22/19    Authorization Type  [employer] PPO    Authorization Time Period  03/10/19 to 04/22/19    PT Start Time  1445    PT Stop Time  1528    PT Time Calculation (min)  43 min    Activity Tolerance  No increased pain;Patient tolerated treatment well    Behavior During Therapy  [facility] for tasks assessed/performed       History reviewed. No pertinent past medical history.  History reviewed. No pertinent surgical history.  There were no vitals filed for this visit.  Subjective Assessment - 03/22/19 1451    Subjective  Pt states that she had a rough day with being back at school. Her hip is bothering her most when she sits for too long or when lifting her leg to push the clutch in her car.    Pertinent History  none    Diagnostic tests  Xray: negative for pathology    Patient Stated Goals  decrease pain with activity    Currently in Pain?  No/denies    Pain Onset  1 to 4 weeks ago         [facility] PT Assessment - 03/22/19 0001      Flexibility   Soft Tissue Assessment /Muscle Length  yes    Quadriceps  (+) Lt thomas test              [facility] Adult PT Treatment/Exercise - 03/22/19 0001      Knee/Hip Exercises: Stretches   Lobbyist  Left;3 reps;10 seconds    Quad Stretch Limitations  standing with LE in chair, Rt LE mini squat hold     Other Knee/Hip Stretches  self trigger point release with tennis ball to Lt quad       Knee/Hip Exercises: Supine   Other Supine Knee/Hip Exercises  BLE bridge off mat table x10 reps       Manual Therapy   Manual  therapy comments  STM, trigger point release Lt quadriceps              PT Education - 03/22/19 1528    Education Details  dry needling info; updates to HEP    Person(s) Educated  Patient    Methods  Explanation;Handout;Verbal cues    Comprehension  Verbalized understanding;Returned demonstration       PT Short Term Goals - 03/10/19 1106      PT SHORT TERM GOAL #1   Title  Pt will have increased Lt hip passive internal rotation and external rotation greater than 30 deg without pain.    Time  3    Period  Weeks    Status  New    Target Date  03/31/19      PT SHORT TERM GOAL #2   Title  Pt will be independent and consistent with her intial HEP.    Time  6    Period  Weeks    Status  New        PT Long Term Goals - 03/10/19 1112  PT LONG TERM GOAL #1   Title  Pt will have 5/5 MMT strength of the Lt hip which will allow her to safely resume tennis/pickleball participation.    Time  6    Period  Weeks    Status  New    Target Date  04/22/19      PT LONG TERM GOAL #2   Title  Pt will report atleast 60% improvement in her pain from the start of PT.    Time  6    Period  Weeks    Status  New      PT LONG TERM GOAL #3   Title  Pt will be able to complete BLE squat x10 reps without weight shift to the Rt.    Time  6    Period  Weeks    Status  New      PT LONG TERM GOAL #4   Title  Pt will be able to complete single leg squat with UE support x5 reps without knee valgus deviation on the Lt, to reflect improved single leg stability.    Time  6    Period  Weeks    Status  New      PT LONG TERM GOAL #5   Title  Pt will be able to return to her running program without exacerbation of Lt hip pain.    Time  6    Period  Weeks    Status  New            Plan - 03/22/19 1528    Clinical Impression Statement  Pt continues to have reports of Lt anterior hip pain and discomfort primarily when sitting throughout the day. She had positive Thomas test on the Lt  for quadriceps tightness. Pt has palpable trigger points and tenderness throughout the Lt quadriceps with reproduction of anterior Lt knee pain during palpation. PT completed soft tissue mobilization techniques to decrease restrictions in the muscle. Pt was instructed in self-trigger point release and massage at home and was able to demonstrate understanding of stretches and glute exercises added at the end of today's session. Pt was provided information regarding dry needling to consider for next session.    Personal Factors and Comorbidities  Time since onset of injury/illness/exacerbation;Fitness    Examination-Activity Limitations  Squat    Examination-Participation Restrictions  Other   recreation   Stability/Clinical Decision Making  Stable/Uncomplicated    Rehab Potential  Good    PT Frequency  Other (comment)   1-2x/week as needed   PT Duration  6 weeks    PT Treatment/Interventions  ADLs/Self Care Home Management;Cryotherapy;Moist Heat;Stair training;Neuromuscular re-education;Balance training;Therapeutic exercise;Therapeutic activities;Patient/family education;Manual techniques;Dry needling;Passive range of motion;Taping    PT Next Visit Plan  d/u on HEP; glute strength and hip rotation strength    PT Home Exercise Plan  [id number]    Consulted and Agree with Plan of Care  Patient       Patient will benefit from skilled therapeutic intervention in order to improve the following deficits and impairments:  Decreased activity tolerance, Improper body mechanics, Pain, Increased muscle spasms, Hypomobility, Decreased strength, Decreased range of motion, Impaired flexibility  Visit Diagnosis: Pain in left hip  Stiffness of left hip, not elsewhere classified     Problem List There are no problems to display for this patient.   4:17 PM,03/22/19 Sherol Dade PT, [place] [facility] [place]  [place]  [facility] [address], [address] [place], [place], [address] Phone: [phone]   Fax:  [phone]  Name: LINDALEE HUIZINGA MRN: [id number] Date of Birth: Aug 19, 1965

## 2019-03-22 NOTE — Patient Instructions (Signed)

## 2019-03-28 ENCOUNTER — Ambulatory Visit: Payer: Commercial Managed Care - PPO | Admitting: Family Medicine

## 2019-03-29 ENCOUNTER — Encounter: Payer: Self-pay | Admitting: Physical Therapy

## 2019-03-29 ENCOUNTER — Ambulatory Visit: Payer: Commercial Managed Care - PPO | Admitting: Physical Therapy

## 2019-03-29 ENCOUNTER — Other Ambulatory Visit: Payer: Self-pay

## 2019-03-29 DIAGNOSIS — M25552 Pain in left hip: Secondary | ICD-10-CM | POA: Diagnosis not present

## 2019-03-29 DIAGNOSIS — M25652 Stiffness of left hip, not elsewhere classified: Secondary | ICD-10-CM

## 2019-03-29 NOTE — Therapy (Signed)
[facility] [address], [address] [place], [place], [address] Phone: [phone]   Fax:  [phone]  Physical Therapy Treatment  Patient Details  Name: Sherri Rodriguez MRN: [id number] Date of Birth: 08-Apr-1965 Referring Provider (PT): Peri Maris, FNP   Encounter Date: 03/29/2019  PT End of Session - 03/29/19 1631    Visit Number  4    Date for PT Re-Evaluation  04/22/19    Authorization Type  [employer] PPO    Authorization Time Period  03/10/19 to 04/22/19    PT Start Time  1446    PT Stop Time  1530    PT Time Calculation (min)  44 min    Activity Tolerance  No increased pain;Patient tolerated treatment well    Behavior During Therapy  [facility] for tasks assessed/performed       History reviewed. No pertinent past medical history.  History reviewed. No pertinent surgical history.  There were no vitals filed for this visit.  Subjective Assessment - 03/29/19 1626    Subjective  Pt would like to hold off on the dry needling for now. She feels that the tennis ball at home has helped alot with pain during work.    Pertinent History  none    Diagnostic tests  Xray: negative for pathology    Patient Stated Goals  decrease pain with activity    Currently in Pain?  Yes    Pain Location  Hip    Pain Orientation  Left;Anterior    Pain Descriptors / Indicators  Aching;Sore    Pain Type  Acute pain    Pain Radiating Towards  none    Pain Onset  1 to 4 weeks ago    Pain Frequency  Intermittent    Aggravating Factors   sitting for long periods of time    Pain Relieving Factors  tennis ball massage                       [facility] Adult PT Treatment/Exercise - 03/29/19 0001      Knee/Hip Exercises: Standing   Other Standing Knee Exercises  Lt hip ER with red TB and foot on 2nd step, avoiding excessive IR to encourage pain free movement x10 reps       Knee/Hip Exercises: Supine   Other Supine Knee/Hip Exercises  self  trigger point release Lt iliacus       Knee/Hip Exercises: Sidelying   Other Sidelying Knee/Hip Exercises  elbow/knee plank with clam, red TB x5 reps each side       Manual Therapy   Manual Therapy  Joint mobilization    Manual therapy comments  STM, trigger point release Lt quadriceps: focus primarily on mid and proximal portions; Lt iliacus trigger point release     Joint Mobilization  Lt hip mobilization with movement: flexion with posterior glide using belt x10 reps AAROM, Lt hip IR with lateral glide using belt x10 reps              PT Education - 03/29/19 1631    Education Details  technique with therex    Person(s) Educated  Patient    Methods  Explanation;Verbal cues;Demonstration    Comprehension  Verbalized understanding;Returned demonstration       PT Short Term Goals - 03/29/19 1636      PT SHORT TERM GOAL #1   Title  Pt will have increased Lt hip passive internal rotation and external rotation greater than  30 deg without pain.    Time  3    Period  Weeks    Status  Achieved    Target Date  03/31/19      PT SHORT TERM GOAL #2   Title  Pt will be independent and consistent with her intial HEP.    Time  6    Period  Weeks    Status  Achieved        PT Long Term Goals - 03/10/19 1112      PT LONG TERM GOAL #1   Title  Pt will have 5/5 MMT strength of the Lt hip which will allow her to safely resume tennis/pickleball participation.    Time  6    Period  Weeks    Status  New    Target Date  04/22/19      PT LONG TERM GOAL #2   Title  Pt will report atleast 60% improvement in her pain from the start of PT.    Time  6    Period  Weeks    Status  New      PT LONG TERM GOAL #3   Title  Pt will be able to complete BLE squat x10 reps without weight shift to the Rt.    Time  6    Period  Weeks    Status  New      PT LONG TERM GOAL #4   Title  Pt will be able to complete single leg squat with UE support x5 reps without knee valgus deviation on the Lt,  to reflect improved single leg stability.    Time  6    Period  Weeks    Status  New      PT LONG TERM GOAL #5   Title  Pt will be able to return to her running program without exacerbation of Lt hip pain.    Time  6    Period  Weeks    Status  New            Plan - 03/29/19 1632    Clinical Impression Statement  Pt responded well to manual treatment to the quadriceps last session. She has been able to self-massage the quadriceps as well and reports minimal pain throughout her workday since then. PT completed trigger point release and massage to the Lt anterior thigh and iliacus without increase in pain. Pt had increased Lt hip internal rotation ROM following hip mobilization. Pt's HEP was updated to reflect improvements in ROM and strength. Will continue with current POC.    Personal Factors and Comorbidities  Time since onset of injury/illness/exacerbation;Fitness    Examination-Activity Limitations  Squat    Examination-Participation Restrictions  Other   recreation   Stability/Clinical Decision Making  Stable/Uncomplicated    Rehab Potential  Good    PT Frequency  Other (comment)   1-2x/week as needed   PT Duration  6 weeks    PT Treatment/Interventions  ADLs/Self Care Home Management;Cryotherapy;Moist Heat;Stair training;Neuromuscular re-education;Balance training;Therapeutic exercise;Therapeutic activities;Patient/family education;Manual techniques;Dry needling;Passive range of motion;Taping    PT Next Visit Plan  f/u on tennis playing, glute strength progression, manual as needed to quads    PT Home Exercise Plan  [id number]    Consulted and Agree with Plan of Care  Patient       Patient will benefit from skilled therapeutic intervention in order to improve the following deficits and impairments:  Decreased activity tolerance, Improper body mechanics, Pain, Increased  muscle spasms, Hypomobility, Decreased strength, Decreased range of motion, Impaired flexibility  Visit  Diagnosis: Pain in left hip  Stiffness of left hip, not elsewhere classified     Problem List There are no problems to display for this patient.  4:37 PM,03/29/19 [place], [place] at [place]  [place] [facility] [address], [place], [place], [address] Phone: [phone]   Fax:  [phone]  Name: Sherri Rodriguez MRN: [id number] Date of Birth: Feb 08, 1966

## 2019-04-05 ENCOUNTER — Other Ambulatory Visit: Payer: Self-pay

## 2019-04-05 ENCOUNTER — Ambulatory Visit: Payer: Commercial Managed Care - PPO | Admitting: Physical Therapy

## 2019-04-05 ENCOUNTER — Encounter: Payer: Self-pay | Admitting: Physical Therapy

## 2019-04-05 DIAGNOSIS — M25552 Pain in left hip: Secondary | ICD-10-CM

## 2019-04-05 DIAGNOSIS — M25652 Stiffness of left hip, not elsewhere classified: Secondary | ICD-10-CM

## 2019-04-06 NOTE — Therapy (Signed)
[facility] [address] [place], [place], [address] Phone: [phone]   Fax:  [phone]  Physical Therapy Treatment  Patient Details  Name: Sherri Rodriguez MRN: [id number] Date of Birth: [birth year] Referring Provider (PT): Peri Maris, FNP   Encounter Date: 04/05/2019  PT End of Session - 04/06/19 0725    Visit Number  5    Date for PT Re-Evaluation  04/22/19    Authorization Type  [employer] PPO    Authorization Time Period  03/10/19 to 04/22/19    PT Start Time  1447    PT Stop Time  1530    PT Time Calculation (min)  43 min    Activity Tolerance  No increased pain;Patient tolerated treatment well    Behavior During Therapy  [facility] for tasks assessed/performed       History reviewed. No pertinent past medical history.  History reviewed. No pertinent surgical history.  There were no vitals filed for this visit.  Subjective Assessment - 04/05/19 1447    Subjective  Pt states that she was able to participate in tennis this past weekend without any difficulty. She has some pain today during and following her day of walking and standing.    Pertinent History  none    Diagnostic tests  Xray: negative for pathology    Patient Stated Goals  decrease pain with activity    Currently in Pain?  Yes    Pain Score  5     Pain Location  Hip    Pain Orientation  Left;Anterior    Pain Descriptors / Indicators  Aching;Dull    Pain Type  Acute pain    Pain Radiating Towards  none    Pain Onset  1 to 4 weeks ago                       [facility] Adult PT Treatment/Exercise - 04/06/19 0001      Knee/Hip Exercises: Stretches   Other Knee/Hip Stretches  foam rolling Lt lateral quadriceps      Knee/Hip Exercises: Scientist, water quality for Curator  power tower #20 sidestepping x5 reps each direction, walking backwards x5 reps (VCs to avoid toe push off)       Knee/Hip Exercises: Standing   Hip Abduction   Left;Stengthening    Abduction Limitations  glute med isometric against wall x3 sec hold, HEP demo       Knee/Hip Exercises: Seated   Other Seated Knee/Hip Exercises  Lt hip IR/ER with yellow TB 2x10 reps each through full available range      Knee/Hip Exercises: Supine   Bridges  Both;1 set;15 reps    Bridges Limitations  green TB clamshell       Manual Therapy   Manual Therapy  Soft tissue mobilization    Soft tissue mobilization  Lt glute med, rolling stick to Lt lateral quadriceps       Trigger Point Dry Needling - 04/06/19 0001    Consent Given?  Yes    Education Handout Provided  Previously provided    Muscles Treated Back/Hip  Gluteus minimus;Gluteus medius    Gluteus Minimus Response  Twitch response elicited;Palpable increased muscle length   Lt   Gluteus Medius Response  Twitch response elicited;Palpable increased muscle length   Lt           PT Education - 04/06/19 0725    Education Details  dry needling aftercare review  Person(s) Educated  Patient    Methods  Explanation    Comprehension  Verbalized understanding       PT Short Term Goals - 03/29/19 1636      PT SHORT TERM GOAL #1   Title  Pt will have increased Lt hip passive internal rotation and external rotation greater than 30 deg without pain.    Time  3    Period  Weeks    Status  Achieved    Target Date  03/31/19      PT SHORT TERM GOAL #2   Title  Pt will be independent and consistent with her intial HEP.    Time  6    Period  Weeks    Status  Achieved        PT Long Term Goals - 03/10/19 1112      PT LONG TERM GOAL #1   Title  Pt will have 5/5 MMT strength of the Lt hip which will allow her to safely resume tennis/pickleball participation.    Time  6    Period  Weeks    Status  New    Target Date  04/22/19      PT LONG TERM GOAL #2   Title  Pt will report atleast 60% improvement in her pain from the start of PT.    Time  6    Period  Weeks    Status  New      PT LONG  TERM GOAL #3   Title  Pt will be able to complete BLE squat x10 reps without weight shift to the Rt.    Time  6    Period  Weeks    Status  New      PT LONG TERM GOAL #4   Title  Pt will be able to complete single leg squat with UE support x5 reps without knee valgus deviation on the Lt, to reflect improved single leg stability.    Time  6    Period  Weeks    Status  New      PT LONG TERM GOAL #5   Title  Pt will be able to return to her running program without exacerbation of Lt hip pain.    Time  6    Period  Weeks    Status  New            Plan - 04/06/19 0726    Clinical Impression Statement  Pt was able to participate in 1.5 hours of tennis over the weekend without exacerbation of her pain. She arrived today with painful Lt anterior hip and lateral knee following her workday. Session focused on increasing hip strength and educating pt on ways to address soft tissue restrictions on her own throughout the week. Pt was agreeable to dry needling of the Lt gluteals, with several twitch responses elicited during this. PT also completed soft tissue mobilization along the gluteals and lateral quadriceps end of session to further decrease soft tissue mobility restrictions. End of session pt denied any increase in pain. Will continue with current POC.    Personal Factors and Comorbidities  Time since onset of injury/illness/exacerbation;Fitness    Examination-Activity Limitations  Squat    Examination-Participation Restrictions  Other   recreation   Stability/Clinical Decision Making  Stable/Uncomplicated    Rehab Potential  Good    PT Frequency  Other (comment)   1-2x/week as needed   PT Duration  6 weeks    PT  Treatment/Interventions  ADLs/Self Care Home Management;Cryotherapy;Moist Heat;Stair training;Neuromuscular re-education;Balance training;Therapeutic exercise;Therapeutic activities;Patient/family education;Manual techniques;Dry needling;Passive range of motion;Taping    PT  Next Visit Plan  f/u on tennis playing 2 days, f/u on dn, glute strength progression, manual as needed to quads    PT Home Exercise Plan  [id number]    Consulted and Agree with Plan of Care  Patient       Patient will benefit from skilled therapeutic intervention in order to improve the following deficits and impairments:  Decreased activity tolerance, Improper body mechanics, Pain, Increased muscle spasms, Hypomobility, Decreased strength, Decreased range of motion, Impaired flexibility  Visit Diagnosis: Pain in left hip  Stiffness of left hip, not elsewhere classified     Problem List There are no problems to display for this patient.   7:30 AM,04/06/19 Donita Brooks PT, DPT [facility] [place]  [phone]  [facility] [address], [address] [place], [place], [address] Phone: [phone]   Fax:  [phone]  Name: Sherri Rodriguez MRN: [id number] Date of Birth: January 07, 1966

## 2019-04-12 ENCOUNTER — Encounter: Payer: Self-pay | Admitting: Physical Therapy

## 2019-04-12 ENCOUNTER — Ambulatory Visit: Payer: Commercial Managed Care - PPO | Attending: Family Medicine | Admitting: Physical Therapy

## 2019-04-12 ENCOUNTER — Other Ambulatory Visit: Payer: Self-pay

## 2019-04-12 DIAGNOSIS — M25552 Pain in left hip: Secondary | ICD-10-CM | POA: Insufficient documentation

## 2019-04-12 DIAGNOSIS — M25652 Stiffness of left hip, not elsewhere classified: Secondary | ICD-10-CM | POA: Insufficient documentation

## 2019-04-12 NOTE — Therapy (Signed)
[facility] [address] [place], [place], [address] Phone: [phone]   Fax:  [phone]  Physical Therapy Treatment  Patient Details  Name: Sherri Rodriguez MRN: [id number] Date of Birth: 11-Oct-1965 Referring Provider (PT): Jillyn Ledger, FNP   Encounter Date: 04/12/2019  PT End of Session - 04/12/19 1634    Visit Number  6    Date for PT Re-Evaluation  04/22/19    Authorization Type  [employer] PPO    Authorization Time Period  03/10/19 to 04/22/19    PT Start Time  1400    PT Stop Time  1442    PT Time Calculation (min)  42 min    Activity Tolerance  No increased pain;Patient tolerated treatment well    Behavior During Therapy  [facility] for tasks assessed/performed       History reviewed. No pertinent past medical history.  History reviewed. No pertinent surgical history.  There were no vitals filed for this visit.  Subjective Assessment - 04/12/19 1404    Subjective  Pt states things are going well. She has minimal discomfort in the anterior hip at this time. Overall, she feels atleast 70-80% improved following dry needling last session.    Pertinent History  none    Diagnostic tests  Xray: negative for pathology    Patient Stated Goals  decrease pain with activity    Currently in Pain?  Yes    Pain Score  1     Pain Location  Hip    Pain Orientation  Left;Anterior    Pain Descriptors / Indicators  Sore    Pain Type  Acute pain    Pain Radiating Towards  none currently    Pain Onset  1 to 4 weeks ago    Pain Frequency  Intermittent    Aggravating Factors   changing directions, prolonged standing/walking    Pain Relieving Factors  dry needling         [facility] PT Assessment - 04/12/19 0001      Squat   Comments  BLE x10 reps without knee valgus       Single Leg Squat   Comments  (+) knee shaking on Lt       Single Leg Stance   Comments  10 sec each, increased difficulty on Lt       Strength   Left  Hip Flexion  4/5   (+) pain    Left Hip Extension  5/5    Left Hip External Rotation  5/5    Left Hip Internal Rotation  5/5    Left Hip ABduction  5/5                   [facility] Adult PT Treatment/Exercise - 04/12/19 0001      Knee/Hip Exercises: Standing   Hip Abduction  Right;Left;Stengthening;1 set;5 reps    Abduction Limitations  glute med iso hold against wall x5 sec hold     Other Standing Knee Exercises  Lt hip ER RNT lunge with Lt LE on 2nd step red TB around knees 5x10sec       Knee/Hip Exercises: Supine   Straight Leg Raises  Both;Strengthening;1 set;10 reps      Knee/Hip Exercises: Prone   Other Prone Exercises  plank on elbows/feet with hip extension 2x5 reps              PT Education - 04/12/19 1704    Education Details  technique with  therex    Person(s) Educated  Patient    Methods  Explanation    Comprehension  Verbalized understanding       PT Short Term Goals - 04/12/19 1412      PT SHORT TERM GOAL #1   Title  Pt will have increased Lt hip passive internal rotation and external rotation greater than 30 deg without pain.    Time  3    Period  Weeks    Status  Achieved    Target Date  03/31/19      PT SHORT TERM GOAL #2   Title  Pt will be independent and consistent with her intial HEP.    Time  6    Period  Weeks    Status  Achieved        PT Long Term Goals - 04/12/19 1412      PT LONG TERM GOAL #1   Title  Pt will have 5/5 MMT strength of the Lt hip which will allow her to safely resume tennis/pickleball participation.    Baseline  5/5 MMT    Time  6    Period  Weeks    Status  Achieved      PT LONG TERM GOAL #2   Title  Pt will report atleast 60% improvement in her pain from the start of PT.    Time  6    Period  Weeks    Status  New      PT LONG TERM GOAL #3   Title  Pt will be able to complete BLE squat x10 reps without weight shift to the Rt.    Baseline  1/10 reps shifted Rt    Time  6    Period  Weeks     Status  Achieved      PT LONG TERM GOAL #4   Title  Pt will be able to complete single leg squat with UE support x5 reps without knee valgus deviation on the Lt, to reflect improved single leg stability.    Time  6    Period  Weeks    Status  New      PT LONG TERM GOAL #5   Title  Pt will be able to return to her running program without exacerbation of Lt hip pain.    Time  6    Period  Weeks    Status  New            Plan - 04/12/19 1634    Clinical Impression Statement  Pt is making good progress towards her goals. She feels 70-80% improved since last session, noting minimal pain throughout her workday and upon arrival. Her hip strength has also increased to 5/5 MMT except for hip extension bilaterally. Pt continues to have discomfort with resisted hip flexion and trigger points along the Lt quadriceps/Lt lateral hip region. PT completed soft tissue mobilization to the region end of session and pt denied increase in pain following this.    Personal Factors and Comorbidities  Time since onset of injury/illness/exacerbation;Fitness    Examination-Activity Limitations  Squat    Examination-Participation Restrictions  Other   recreation   Stability/Clinical Decision Making  Stable/Uncomplicated    Rehab Potential  Good    PT Frequency  Other (comment)   1-2x/week as needed   PT Duration  6 weeks    PT Treatment/Interventions  ADLs/Self Care Home Management;Cryotherapy;Moist Heat;Stair training;Neuromuscular re-education;Balance training;Therapeutic exercise;Therapeutic activities;Patient/family education;Manual techniques;Dry needling;Passive range of motion;Taping  PT Next Visit Plan  d/n quads/TFL; possible d/c with updated HEP; knee valgus control    PT Home Exercise Plan  [id number]    Consulted and Agree with Plan of Care  Patient       Patient will benefit from skilled therapeutic intervention in order to improve the following deficits and impairments:  Decreased activity  tolerance, Improper body mechanics, Pain, Increased muscle spasms, Hypomobility, Decreased strength, Decreased range of motion, Impaired flexibility  Visit Diagnosis: Pain in left hip  Stiffness of left hip, not elsewhere classified     Problem List There are no problems to display for this patient.   5:06 PM,04/12/19 Donita Brooks PT, DPT [facility] [place]  [phone]  [facility] [address] [place], [place], [address] Phone: [phone]   Fax:  [phone]  Name: Sherri Rodriguez MRN: [id number] Date of Birth: 12-17-1965

## 2019-04-18 ENCOUNTER — Ambulatory Visit: Payer: Commercial Managed Care - PPO

## 2019-04-18 ENCOUNTER — Ambulatory Visit: Payer: Commercial Managed Care - PPO | Attending: Internal Medicine

## 2019-04-18 DIAGNOSIS — Z23 Encounter for immunization: Secondary | ICD-10-CM

## 2019-04-18 NOTE — Progress Notes (Signed)
   Covid-19 Vaccination Clinic  Name:  Sherri Rodriguez    MRN: [id number] DOB: 1965/06/27  04/18/2019  [name] was observed post Covid-19 immunization for 15 minutes without incidence. She was provided with Vaccine Information Sheet and instruction to access the V-Safe system.   [name] was instructed to call 911 with any severe reactions post vaccine: Marland Kitchen Difficulty breathing  . Swelling of your face and throat  . A fast heartbeat  . A bad rash all over your body  . Dizziness and weakness    Immunizations Administered    Name Date Dose VIS Date Route   Pfizer COVID-19 Vaccine 04/18/2019  6:02 PM 0.3 mL 02/18/2019 Intramuscular   Manufacturer: [employer], [employer]   Lot: [id number]   NDC: [address]-3

## 2019-04-19 ENCOUNTER — Ambulatory Visit: Payer: Commercial Managed Care - PPO | Admitting: Physical Therapy

## 2019-04-19 ENCOUNTER — Other Ambulatory Visit: Payer: Self-pay

## 2019-04-19 ENCOUNTER — Encounter: Payer: Self-pay | Admitting: Physical Therapy

## 2019-04-19 DIAGNOSIS — M25652 Stiffness of left hip, not elsewhere classified: Secondary | ICD-10-CM

## 2019-04-19 DIAGNOSIS — M25552 Pain in left hip: Secondary | ICD-10-CM | POA: Diagnosis not present

## 2019-04-20 NOTE — Therapy (Signed)
[facility] [address], [place], [place], [address] Phone: [phone]   Fax:  [phone]  Physical Therapy Treatment/Re-eval  Patient Details  Name: Sherri Rodriguez MRN: [id number] Date of Birth: September 16, 1965 Referring Provider (PT): Jillyn Ledger, FNP   Encounter Date: 04/19/2019  PT End of Session - 04/19/19 1605    Visit Number  7    Date for PT Re-Evaluation  05/20/19    Authorization Type  UHC PPO    Authorization Time Period  03/10/19 to 04/22/19    PT Start Time  1448   dry needling completed during portion of session   PT Stop Time  1530    PT Time Calculation (min)  42 min    Activity Tolerance  No increased pain;Patient tolerated treatment well    Behavior During Therapy  [facility] for tasks assessed/performed       History reviewed. No pertinent past medical history.  History reviewed. No pertinent surgical history.  There were no vitals filed for this visit.  Subjective Assessment - 04/19/19 1450    Subjective  Pt states that her hip really bothered her yesterday while at work. She was sitting alot and doesn't know if that's part of it. She states she played tennis and it just felt ok. No pain currently.    Pertinent History  none    Diagnostic tests  Xray: negative for pathology    Patient Stated Goals  decrease pain with activity    Currently in Pain?  No/denies    Pain Onset  1 to 4 weeks ago         [facility] PT Assessment - 04/20/19 0001      Assessment   Medical Diagnosis  Pain in left hip     Referring Provider (PT)  Jillyn Ledger, FNP    Onset Date/Surgical Date  --   Feb 09, 2019   Next MD Visit  none     Prior Therapy  none       Precautions   Precautions  None      Restrictions   Weight Bearing Restrictions  No      Prior Function   Leisure  plays pickleball/tennis throughout the week; recently began a couch to 5k       Cognition   Overall Cognitive Status  Within Functional  Limits for tasks assessed      Sensation   Additional Comments  denies numbness/tingling       Functional Tests   Functional tests  Single Leg Squat;Squat;Single leg stance      Squat   Comments  BLE x10 reps without knee valgus       Single Leg Squat   Comments  (+) knee shaking on Lt       Single Leg Stance   Comments  10 sec each, increased difficulty on Lt       AROM   Overall AROM Comments  --      Strength   Right Hip Flexion  5/5    Right Hip Extension  5/5    Right Hip ABduction  5/5    Left Hip Flexion  4/5   (+) pain   Left Hip Extension  5/5    Left Hip External Rotation  5/5    Left Hip Internal Rotation  5/5    Left Hip ABduction  5/5      Flexibility   Quadriceps  (+) thomas test Lt and Rt, pain on  Lt       Palpation   Palpation comment  tenderness Lt ITB, proximal quadriceps, Lt iliacus                   [facility] Adult PT Treatment/Exercise - 04/20/19 0001      Knee/Hip Exercises: Standing   Hip Abduction  Right;Left;Stengthening;1 set;5 reps    Abduction Limitations  glute med iso hold against wall x5 sec hold        Trigger Point Dry Needling - 04/20/19 0001    Consent Given?  Yes    Education Handout Provided  Previously provided    Muscles Treated Lower Quadrant  Quadriceps    Quadriceps Response  Twitch response elicited;Palpable increased muscle length   Lt   Gluteus Minimus Response  Twitch response elicited;Palpable increased muscle length   Lt   Gluteus Medius Response  Twitch response elicited;Palpable increased muscle length   Lt          PT Education - 04/19/19 1600    Education Details  encouraged pt to assess response to activity moving forward for better treatment direction moving forward; progress towards goals    Person(s) Educated  Patient    Methods  Explanation    Comprehension  Verbalized understanding       PT Short Term Goals - 04/12/19 1412      PT SHORT TERM GOAL #1   Title  Pt will have  increased Lt hip passive internal rotation and external rotation greater than 30 deg without pain.    Time  3    Period  Weeks    Status  Achieved    Target Date  03/31/19      PT SHORT TERM GOAL #2   Title  Pt will be independent and consistent with her intial HEP.    Time  6    Period  Weeks    Status  Achieved        PT Long Term Goals - 04/20/19 6384      PT LONG TERM GOAL #1   Title  Pt will have 5/5 MMT strength of the Lt hip which will allow her to safely resume tennis/pickleball participation.    Baseline  5/5 MMT except for Lt hip flexion 4/5 due to pain    Time  6    Period  Weeks    Status  Partially Met      PT LONG TERM GOAL #2   Title  Pt will report atleast 60% improvement in her pain from the start of PT.    Baseline  70%    Time  6    Period  Weeks    Status  Achieved      PT LONG TERM GOAL #3   Title  Pt will be able to complete BLE squat x10 reps without weight shift to the Rt.    Baseline  1/10 reps shifted Rt    Time  6    Period  Weeks    Status  Achieved      PT LONG TERM GOAL #4   Title  Pt will be able to complete single leg squat with UE support x5 reps without knee valgus deviation on the Lt, to reflect improved single leg stability.    Time  6    Period  Weeks    Status  On-going      PT LONG TERM GOAL #5   Title  Pt will be able to  return to her running program without exacerbation of Lt hip pain.    Baseline  pt has not returned to recreational running yet    Time  6    Period  Weeks    Status  On-going            Plan - 04/19/19 1606    Clinical Impression Statement  t has made good progress towards her goals, meeting all short term and all but 2 of her long term goals since beginning PT. She felt atleast 70% improved at her last session and has responded well to dry needling and exercise, however her pain appears to fluctuate throughout the week without known cause. Pt finds it difficult to note specific positions or movements  that increase her pain throughout the day, and this has made it somewhat difficult to guide treatment. Pt's hip strength is greatly improved, with Lt hip flexion being 4/5 MMT with painful resistance. Her bilateral LE functional strength and neuromuscular control has improved, but she has noted difficulty with Lt hip stability particularly during single leg stance and single leg squat. Pt would continue to benefit from skilled PT to further increase her Lt hip strength and control with safe return to recreational running and other daily activity without increase in pain.    Personal Factors and Comorbidities  Time since onset of injury/illness/exacerbation;Fitness    Examination-Activity Limitations  Squat    Examination-Participation Restrictions  Other   recreation   Stability/Clinical Decision Making  Stable/Uncomplicated    Rehab Potential  Good    PT Frequency  1x / week   1-2x/week as needed   PT Duration  4 weeks    PT Treatment/Interventions  ADLs/Self Care Home Management;Cryotherapy;Moist Heat;Stair training;Neuromuscular re-education;Balance training;Therapeutic exercise;Therapeutic activities;Patient/family education;Manual techniques;Dry needling;Passive range of motion;Taping    PT Next Visit Plan  d/n quads/TFL; possible d/c with updated HEP; knee valgus control    PT Home Exercise Plan  [id number]    Consulted and Agree with Plan of Care  Patient       Patient will benefit from skilled therapeutic intervention in order to improve the following deficits and impairments:  Decreased activity tolerance, Improper body mechanics, Pain, Increased muscle spasms, Hypomobility, Decreased strength, Decreased range of motion, Impaired flexibility  Visit Diagnosis: Pain in left hip  Stiffness of left hip, not elsewhere classified     Problem List There are no problems to display for this patient.   7:17 AM,04/20/19 [place], [place] at  [place]  [facility] [address] [place], [place], [address] Phone: [phone]   Fax:  [phone]  Name: PHYNIX HORTON MRN: [id number] Date of Birth: 1966-01-05

## 2019-05-02 ENCOUNTER — Other Ambulatory Visit: Payer: Self-pay

## 2019-05-02 ENCOUNTER — Ambulatory Visit: Payer: Commercial Managed Care - PPO

## 2019-05-02 DIAGNOSIS — M25552 Pain in left hip: Secondary | ICD-10-CM

## 2019-05-02 DIAGNOSIS — M25652 Stiffness of left hip, not elsewhere classified: Secondary | ICD-10-CM

## 2019-05-02 NOTE — Therapy (Addendum)
[facility] [address] [place], [place], [address] Phone: [phone]   Fax:  [phone]  Physical Therapy Treatment  Patient Details  Name: Sherri Rodriguez MRN: [id number] Date of Birth: Nov 03, 1965 Referring Provider (PT): Jillyn Ledger, FNP   Encounter Date: 05/02/2019  PT End of Session - 05/02/19 1614    Visit Number  8    Date for PT Re-Evaluation  05/20/19    Authorization Type  [employer]HC PPO    PT Start Time  1534   dry needling   PT Stop Time  1612    PT Time Calculation (min)  38 min    Activity Tolerance  No increased pain;Patient tolerated treatment well    Behavior During Therapy  [facility] for tasks assessed/performed       History reviewed. No pertinent past medical history.  History reviewed. No pertinent surgical history.  There were no vitals filed for this visit.  Subjective Assessment - 05/02/19 1538    Subjective  I was doing OK and then had a flare-up over the weekend. I feel better now and have been able to play pickleball    Currently in Pain?  No/denies   up to 8/10 3 days ago   Pain Location  Buttocks    Pain Orientation  Left                       [facility] Adult PT Treatment/Exercise - 05/02/19 0001      Knee/Hip Exercises: Stretches   Piriformis Stretch  Left;2 reps;20 seconds      Knee/Hip Exercises: Standing   Other Standing Knee Exercises  hip hike at edge of step x10 with tactile cues      Knee/Hip Exercises: Supine   Bridges  Both;1 set;15 reps    Bridges Limitations  edge of mat with ball squeeze      Manual Therapy   Manual Therapy  Soft tissue mobilization    Manual therapy comments  elongation and trigger point release to Lt gluteals        Trigger Point Dry Needling - 05/02/19 0001    Consent Given?  Yes    Muscles Treated Back/Hip  Gluteus minimus;Gluteus medius   Lt only   Gluteus Minimus Response  Twitch response elicited;Palpable increased  muscle length    Gluteus Medius Response  Twitch response elicited;Palpable increased muscle length             PT Short Term Goals - 04/12/19 1412      PT SHORT TERM GOAL #1   Title  Pt will have increased Lt hip passive internal rotation and external rotation greater than 30 deg without pain.    Time  3    Period  Weeks    Status  Achieved    Target Date  03/31/19      PT SHORT TERM GOAL #2   Title  Pt will be independent and consistent with her intial HEP.    Time  6    Period  Weeks    Status  Achieved        PT Long Term Goals - 04/20/19 0350      PT LONG TERM GOAL #1   Title  Pt will have 5/5 MMT strength of the Lt hip which will allow her to safely resume tennis/pickleball participation.    Baseline  5/5 MMT except for Lt hip flexion 4/5 due to pain    Time  6    Period  Weeks    Status  Partially Met      PT LONG TERM GOAL #2   Title  Pt will report atleast 60% improvement in her pain from the start of PT.    Baseline  70%    Time  6    Period  Weeks    Status  Achieved      PT LONG TERM GOAL #3   Title  Pt will be able to complete BLE squat x10 reps without weight shift to the Rt.    Baseline  1/10 reps shifted Rt    Time  6    Period  Weeks    Status  Achieved      PT LONG TERM GOAL #4   Title  Pt will be able to complete single leg squat with UE support x5 reps without knee valgus deviation on the Lt, to reflect improved single leg stability.    Time  6    Period  Weeks    Status  On-going      PT LONG TERM GOAL #5   Title  Pt will be able to return to her running program without exacerbation of Lt hip pain.    Baseline  pt has not returned to recreational running yet    Time  6    Period  Weeks    Status  On-going            Plan - 05/02/19 1613    Clinical Impression Statement  Pt with flare-up of Lt gluteal pain 3 days ago and reports up to 8/10 pain.  Pain has now resolved and pt was able to play pickleball again.  Pt with  trigger points in Lt gluteals and demonstrated improved tissue mobility after manual therapy today.  PT reviewed HEP with patient and added hip hike for Lt glute med strength (no picture issued).  Pt will continue to benefit from skilled PT to address Lt gluteal pain and allow for improved functional strength.    PT Frequency  1x / week    PT Duration  4 weeks    PT Treatment/Interventions  ADLs/Self Care Home Management;Cryotherapy;Moist Heat;Stair training;Neuromuscular re-education;Balance training;Therapeutic exercise;Therapeutic activities;Patient/family education;Manual techniques;Dry needling;Passive range of motion;Taping    PT Next Visit Plan  dry needling, review hip hike, check goals    PT Home Exercise Plan  [id number]    Recommended Other Services  recert is signed    Consulted and Agree with Plan of Care  Patient       Patient will benefit from skilled therapeutic intervention in order to improve the following deficits and impairments:  Decreased activity tolerance, Improper body mechanics, Pain, Increased muscle spasms, Hypomobility, Decreased strength, Decreased range of motion, Impaired flexibility  Visit Diagnosis: Pain in left hip  Stiffness of left hip, not elsewhere classified     Problem List There are no problems to display for this patient.    Sigurd Sos, PT 05/02/19 4:17 PM PHYSICAL THERAPY DISCHARGE SUMMARY  Visits from Start of Care: 8  Current functional level related to goals / functional outcomes: See above for current HEP.  Pt didn't return to PT.     Remaining deficits: See above.     Education / Equipment: HEP Plan: Patient agrees to discharge.  Patient goals were partially met. Patient is being discharged due to not returning since the last visit.  ?????        Sigurd Sos, [place] 08/15/19  10:24 AM   [facility] [address], [place], [place], [address] Phone:  [phone]   Fax:  [phone]  Name: Sherri Rodriguez MRN: [id number] Date of Birth: 24-Aug-1965

## 2019-05-14 ENCOUNTER — Ambulatory Visit: Payer: Commercial Managed Care - PPO | Attending: Internal Medicine

## 2019-05-14 DIAGNOSIS — Z23 Encounter for immunization: Secondary | ICD-10-CM | POA: Insufficient documentation

## 2019-05-14 NOTE — Progress Notes (Signed)
   Covid-19 Vaccination Clinic  Name:  Sherri Rodriguez    MRN: [id number] DOB: 02-05-1966  05/14/2019  Sherri Rodriguez was observed post Covid-19 immunization for 15 minutes without incident. She was provided with Vaccine Information Sheet and instruction to access the V-Safe system.   Sherri Rodriguez was instructed to call 911 with any severe reactions post vaccine: Marland Kitchen Difficulty breathing  . Swelling of face and throat  . A fast heartbeat  . A bad rash all over body  . Dizziness and weakness   Immunizations Administered    Name Date Dose VIS Date Route   Pfizer COVID-19 Vaccine 05/14/2019 12:17 PM 0.3 mL 02/18/2019 Intramuscular   Manufacturer: [employer], [employer]   Lot: [id number]   NDC: [address]-6

## 2019-06-11 ENCOUNTER — Telehealth: Payer: Commercial Managed Care - PPO | Admitting: Family

## 2019-06-11 DIAGNOSIS — L237 Allergic contact dermatitis due to plants, except food: Secondary | ICD-10-CM | POA: Diagnosis not present

## 2019-06-11 MED ORDER — PREDNISONE 20 MG PO TABS: 20.0000 mg | ORAL_TABLET | Freq: Every day | ORAL | 0 refills | Status: DC

## 2019-06-11 NOTE — Progress Notes (Signed)
We are sorry that you are not feeing well.  Here is how we plan to help! ° °Based on what you have shared with me it looks like you have had an allergic reaction to the oily resin from a group of plants.  This resin is very sticky, so it easily attaches to your skin, clothing, tools equipment, and pet's fur.   ° °This blistering rash is often called poison ivy rash although it can come from contact with the leaves, stems and roots of poison ivy, poison oak and poison sumac. ° °The oily resin contains urushiol (u-ROO-she-ol) that produces a skin rash on exposed skin.  The severity of the rash depends on the amount of urushiol that gets on your skin.  A section of skin with more urushiol on it may develop a rash sooner. ° °The rash usually develops 12-48 hours after exposure and can last two to three weeks.  Your skin must come in direct contact with the plant's oil to be affected.  Blister fluid doesn't spread the rash.  However, if you come into contact with a piece of clothing or pet fur that has urushiol on it, the rash may spread out.  You can also transfer the oil to other parts of your body with your fingers. ° °Often the rash looks like a straight line because of the way the plant brushes against your skin.   ° °I have developed the following plan to treat your condition Since your rash is widespread or has resulted in a large number of blisters, I have prescribed an oral corticosteroid.  Please follow these recommendations:  I have sent a prednisone dose pack to your chosen pharmacy. Be sure to follow the instructions carefully and complete the entire prescription. You may use Benadryl or Caladryl topical lotions to sooth the itch and remember cool, not hot, showers and baths can help relieve the itching!  Place cool, wet compresses on the affected area for 15-30 minutes several times a day.  You may also take oral antihistamines, such as diphenhydramine (Benadryl, others), which may also help you sleep  better.  Watch your skin for any purulent (pus) drainage or red streaking from the site.  If this occurs, contact your provider.  You may require an antibiotic for a skin infection.  Make sure that the clothes you were wearing as well as any towels or sheets that may have come in contact with the oil (urushiol) are washed in detergent and hot water.        ° °What can you do to prevent this rash? ° °Avoid the plants.  Learn how to identify poison ivy, poison oak and poison sumac in all seasons.  When hiking or engaging in other activities that might expose you to these plants, try to stay on cleared pathways.  If camping, make sure you pitch your tent in an area free of these plants.  Keep pets from running through wooded areas so that urushiol doesn't accidentally stick to their fur, which you may touch. ° °Remove or kill the plants.  In your yard, you can get rid of poison ivy by applying an herbicide or pulling it out of the ground, including the roots, while wearing heavy gloves.  Afterward remove the gloves and thoroughly wash them and your hands.  Don't burn poison ivy or related plants because the urushiol can be carried by smoke. ° °Wear protective clothing.  If needed, protect your skin by wearing socks, boots, pants, long   sleeves and vinyl gloves. ° °Wash your skin right away.  Washing off the oil with soap and water within 30 minutes of exposure may reduce your chances of getting a poison ivy rash.  Even washing after an hour or so can help reduce the severity of the rash. ° °If you walk through some poison ivy and then later touch your shoes, you may get some urushiol on your hands, which may then transfer to your face or body by touching or rubbing.  If the contaminated object isn't cleaned, the urushiol on it can still cause a skin reaction years later. ° ° ° °Be careful not to reuse towels after you have washed your skin.  Also carefully wash clothing in detergent and hot water to remove all traces of  the oil.  Handle contaminated clothing carefully so you don't transfer the urushiol to yourself, furniture, rugs or appliances. ° °Remember that pets can carry the oil on their fur and paws.  If you think your pet may be contaminated with urushiol, put on some long rubber gloves and give your pet a bath. ° °Finally, be careful not to burn these plants as the smoke can contain traces of the oil.  Inhaling the smoke may result in difficulty breathing. If that occurred you should see a physician as soon as possible. ° °See your doctor right away if: ° °· The reaction is severe or widespread °· You inhaled the smoke from burning poison ivy and are having difficulty breathing °· Your skin continues to swell °· The rash affects your eyes, mouth or genitals °· Blisters are oozing pus °· You develop a fever greater than 100 F (37.8 C) °· The rash doesn't get better within a few weeks. ° °If you scratch the poison ivy rash, bacteria under your fingernails may cause the skin to become infected.  See your doctor if pus starts oozing from the blisters.  Treatment generally includes antibiotics. ° °Poison ivy treatments are usually limited to self-care methods.  And the rash typically goes away on its own in two to three weeks.    ° °If the rash is widespread or results in a large number of blisters, your doctor may prescribe an oral corticosteroid, such as prednisone.  If a bacterial infection has developed at the rash site, your doctor may give you a prescription for an oral antibiotic. ° °MAKE SURE YOU ° °· Understand these instructions. °· Will watch your condition. °· Will get help right away if you are not doing well or get worse. ° °Thank you for choosing an e-visit. °Your e-visit answers were reviewed by a board certified advanced clinical practitioner to complete your personal care plan. Depending upon the condition, your plan could have included both over the counter or prescription medications. ° °Please review your  pharmacy choice. If there is a problem you may use MyChart messaging to have the prescription routed to another pharmacy. ° ° °Your safety is important to us. If you have drug allergies check your prescription carefully.  °You can use MyChart to ask questions about today’s visit, request a non-urgent call back, or ask for a work or school excuse for 24 hours related to this e-Visit. If it has been greater than 24 hours you will need to follow up with your provider, or enter a new e-Visit to address those concerns. ° ° °You will get an email in the next two days asking about your experience. I hope that your e-visit has been   valuable and will speed your recovery °Thank you for choosing an e-visit. ° °  °Greater than 5 minutes, yet less than 10 minutes of time have been spent researching, coordinating, and implementing care for this patient today. ° °Thank you for the details you included in the comment boxes. Those details are very helpful in determining the best course of treatment for you and help us to provide the best care. ° °

## 2019-06-16 ENCOUNTER — Ambulatory Visit (INDEPENDENT_AMBULATORY_CARE_PROVIDER_SITE_OTHER): Payer: Commercial Managed Care - PPO | Admitting: Physician Assistant

## 2019-06-16 ENCOUNTER — Encounter: Payer: Self-pay | Admitting: Physician Assistant

## 2019-06-16 ENCOUNTER — Other Ambulatory Visit: Payer: Self-pay

## 2019-06-16 ENCOUNTER — Ambulatory Visit: Payer: Self-pay

## 2019-06-16 DIAGNOSIS — M25561 Pain in right knee: Secondary | ICD-10-CM

## 2019-06-16 MED ORDER — METHYLPREDNISOLONE ACETATE 40 MG/ML IJ SUSP
40.0000 mg | INTRAMUSCULAR | Status: AC | PRN
  Administered 2019-06-16: 40 mg via INTRA_ARTICULAR

## 2019-06-16 MED ORDER — LIDOCAINE HCL 1 % IJ SOLN
3.0000 mL | INTRAMUSCULAR | Status: AC | PRN
  Administered 2019-06-16: 3 mL

## 2019-06-16 NOTE — Progress Notes (Signed)
Office Visit Note   Patient: Sherri Rodriguez           Date of Birth: [birth year]           MRN: [id number] Visit Date: 06/16/2019              Requested by: Kristen Loader, [address],  [place] [address] PCP: Kristen Loader, FNP   Assessment & Plan: Visit Diagnoses:  1. Acute pain of right knee     Plan: She will work on quad strengthening as shown.  Discussed with her knee friendly exercises.  Like to see her back in 2 weeks to see what type of response she had to the right knee injection.  Questions encouraged and answered at length.  Follow-Up Instructions: Return in about 2 weeks (around 06/30/2019).   Orders:  Orders Placed This Encounter  Procedures  . XR Knee 1-2 Views Right   No orders of the defined types were placed in this encounter.     Procedures: Large Joint Inj: R knee on 06/16/2019 4:54 PM Indications: pain Details: 22 G 1.5 in needle, anterolateral approach  Arthrogram: No  Medications: 3 mL lidocaine 1 %; 40 mg methylPREDNISolone acetate 40 MG/ML Aspirate: 3 mL blood-tinged Outcome: tolerated well, no immediate complications Procedure, treatment alternatives, risks and benefits explained, specific risks discussed. Consent was given by the patient. Immediately prior to procedure a time out was called to verify the correct patient, procedure, equipment, support staff and site/side marked as required. Patient was prepped and draped in the usual sterile fashion.       Clinical Data: No additional findings.   Subjective: Chief Complaint  Patient presents with  . Right Knee - Pain    HPI Patient is 54 year old female was seen for the first time today.  She comes in with right knee pain for the last 2 weeks.  States knee feels very weak.  She is having some giving way catching sensation.  She has had no injury to the knee.  She has tried ibuprofen without any real relief.  She notes some swelling in the knee.  He does have had no  therapy for either knee.  She does go to physical therapy for strengthening of her left hip.  She had a recent bout with poison ivy's had I am injection for this with prednisone she is also been on oral prednisone is tapering down off of this at this point time. Has some discomfort in the left knee but not to the point that she has a right knee.  No mechanical symptoms of the left knee. Review of Systems Negative for fevers chills shortness breath chest pain  Objective: Vital Signs: There were no vitals taken for this visit.  Physical Exam Constitutional:      Appearance: She is not ill-appearing or diaphoretic.  Pulmonary:     Effort: Pulmonary effort is normal.  Neurological:     Mental Status: She is alert and oriented to person, place, and time.  Psychiatric:        Behavior: Behavior normal.     Ortho Exam Bilateral knees good range of motion.  Crepitus with passive range of motion both knees.  No instability valgus varus stressing of either knee.  Slight edema effusion of the right knee.  No abnormal warmth erythema of either knee.  Anterior drawer is negative bilaterally.  Raise is negative bilaterally.  Tenderness along medial joint line of the right knee. Specialty  Comments:  No specialty comments available.  Imaging: XR Knee 1-2 Views Right  Result Date: 06/16/2019 Right knee AP and lateral views: No acute fracture.  Knee is well located.  Tricompartmental changes with mild lateral tricompartmental changes.  Moderate narrowing medial joint line and moderate patellofemoral changes.  No bony abnormalities.  Left knee on the AP view shows  multiple loose bodies within the knee joint but no lateral x-ray was obtained today.    PMFS History: There are no problems to display for this patient.  History reviewed. No pertinent past medical history.  History reviewed. No pertinent family history.  History reviewed. No pertinent surgical history. Social History   Occupational  History  . Not on file  Tobacco Use  . Smoking status: Not on file  Substance and Sexual Activity  . Alcohol use: Not on file  . Drug use: Not on file  . Sexual activity: Not on file

## 2019-06-20 ENCOUNTER — Other Ambulatory Visit: Payer: Self-pay | Admitting: Radiology

## 2019-06-20 DIAGNOSIS — M25561 Pain in right knee: Secondary | ICD-10-CM

## 2019-06-20 NOTE — Telephone Encounter (Signed)
Riding a bike is fine no tennis

## 2019-06-30 ENCOUNTER — Telehealth: Payer: Self-pay | Admitting: Physician Assistant

## 2019-06-30 ENCOUNTER — Telehealth: Payer: Self-pay | Admitting: Radiology

## 2019-06-30 ENCOUNTER — Ambulatory Visit: Payer: Commercial Managed Care - PPO | Admitting: Physician Assistant

## 2019-06-30 ENCOUNTER — Other Ambulatory Visit: Payer: Self-pay

## 2019-06-30 NOTE — Telephone Encounter (Signed)
Left voicemail for patient to call and reschedule todays appt at 3:30 until after her MRI on 07/14/19. Can you please try patient again? Thank you!

## 2019-06-30 NOTE — Telephone Encounter (Signed)
Called patient left voicemail message to call back to reschedule appointment until after her MRI 07/14/2019

## 2019-07-07 ENCOUNTER — Other Ambulatory Visit: Payer: Self-pay

## 2019-07-07 ENCOUNTER — Ambulatory Visit (INDEPENDENT_AMBULATORY_CARE_PROVIDER_SITE_OTHER): Payer: Commercial Managed Care - PPO | Admitting: Physician Assistant

## 2019-07-07 ENCOUNTER — Ambulatory Visit: Payer: Self-pay

## 2019-07-07 ENCOUNTER — Encounter: Payer: Self-pay | Admitting: Physician Assistant

## 2019-07-07 DIAGNOSIS — M25552 Pain in left hip: Secondary | ICD-10-CM | POA: Diagnosis not present

## 2019-07-07 NOTE — Progress Notes (Signed)
Office Visit Note   Patient: Sherri Rodriguez           Date of Birth: July 01, 1965           MRN: [id number] Visit Date: 07/07/2019              Requested by: Kristen Loader, [address],  [place] [address] PCP: Kristen Loader, FNP   Assessment & Plan: Visit Diagnoses:  1. Pain in left hip     Plan: Patient is failed conservative treatment for left hip pain this is included physical therapy for 6 weeks, over-the-counter NSAIDs and time.  We will obtain an MRI of her left hip to rule out hip pathology as a source of her pain.  This will be to evaluate her cartilage primarily.  We will see her back after the MRI to go over results discuss further treatment.  Questions were encouraged and answered.  Follow-Up Instructions: Return After MRI.   Orders:  No orders of the defined types were placed in this encounter.  No orders of the defined types were placed in this encounter.     Procedures: No procedures performed   Clinical Data: No additional findings.   Subjective: Chief Complaint  Patient presents with  . Left Hip - Pain    HPI Sherri Rodriguez returns today due to left hip pain.  She is already been evaluated for the right knee pain is going to undergo an MRI of the right knee.  She wanted an MRI of her left hip.  She was having left hip pain for 5 months or greater.  She has been to physical therapy for 6 to 8 weeks which helped initially but sitting after stopping therapy her pain returned.  She has tried some over-the-counter NSAIDs with some relief but this is also short-lived.  She rates her left hip pain to be 5-6 out of 10 pain at worst.  Is constant achy pain within the hip groin.  Worse with sitting.  She denies any back pain denies any radicular symptoms down the leg.  She also has difficulty getting in and out of a car due to the hip pain.  Trying to pedal bike causes her pain within the hip.  She denies any snapping or popping-like sensation in  the hip.  She also notes with flexion of the hip she has pain in the groin. Radiographs on canopy AP pelvis and lateral view of the left hip dated December 2020 are personally reviewed.  These show both hips to be well located.  Some very minimal arthritic changes.  Otherwise no bony abnormalities or acute fractures.  Review of Systems See HPI otherwise negative or noncontributory.  Objective: Vital Signs: There were no vitals taken for this visit.  Physical Exam Constitutional:      Appearance: She is not ill-appearing or diaphoretic.  Pulmonary:     Effort: Pulmonary effort is normal.  Neurological:     Mental Status: She is alert and oriented to person, place, and time.  Psychiatric:        Mood and Affect: Mood normal.     Ortho Exam Bilateral hips excellent range of motion.  She has discomfort with internal rotation of the left hip only.  Minimal tenderness over the left trochanteric region no tenderness over the right trochanteric region.  Left knee positive crepitus with passive range of motion.  No abnormal warmth erythema or effusion of the left knee.  No instability  valgus varus stressing left knee. Specialty Comments:  No specialty comments available.  Imaging: No results found.   PMFS History: There are no problems to display for this patient.  History reviewed. No pertinent past medical history.  History reviewed. No pertinent family history.  History reviewed. No pertinent surgical history. Social History   Occupational History  . Not on file  Tobacco Use  . Smoking status: Not on file  Substance and Sexual Activity  . Alcohol use: Not on file  . Drug use: Not on file  . Sexual activity: Not on file

## 2019-07-07 NOTE — Addendum Note (Signed)
Addended by: Mardene Celeste B on: 07/07/2019 04:52 PM   Modules accepted: Orders

## 2019-07-14 ENCOUNTER — Ambulatory Visit
Admission: RE | Admit: 2019-07-14 | Discharge: 2019-07-14 | Disposition: A | Discharge status: Home / Self Care | Payer: Commercial Managed Care - PPO | Source: Ambulatory Visit | Attending: Physician Assistant | Admitting: Physician Assistant

## 2019-07-14 ENCOUNTER — Other Ambulatory Visit: Payer: Self-pay

## 2019-07-14 DIAGNOSIS — M25561 Pain in right knee: Secondary | ICD-10-CM

## 2019-07-14 IMAGING — MR MR KNEE*R* W/O CM
4 of 6 series · 21 of 40 positions shown · non-contrast
Comparison: X-ray [DATE]

CLINICAL DATA: Right knee pain for 3 months

EXAM:
MRI OF THE RIGHT KNEE WITHOUT CONTRAST
TECHNIQUE: Multiplanar, multisequence MR imaging of the knee was performed. No
intravenous contrast was administered.

[Series 6: T2 fat-sat · axial · 4.0mm · 0.50mm/px · z∈[-96,-1]mm · 4 of 24 slices shown (1 of 2)]
[im 1/24]
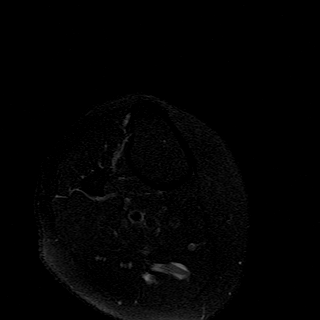
[im 4/24]
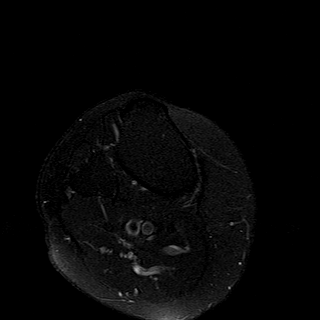
[im 12/24]
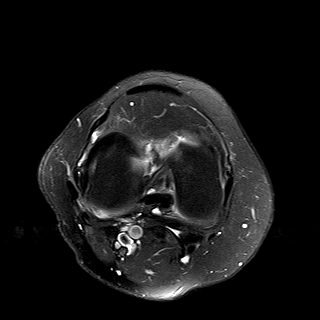
[im 20/24]
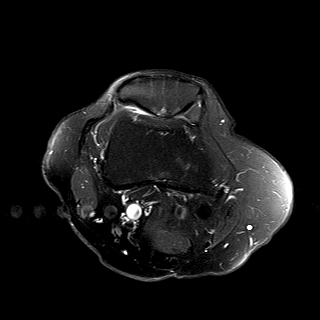

[Series 8: T2 fat-sat · coronal · 4.0mm · 0.29mm/px · 3 of 22 slices shown (2 of 2)]
[im 5/22]
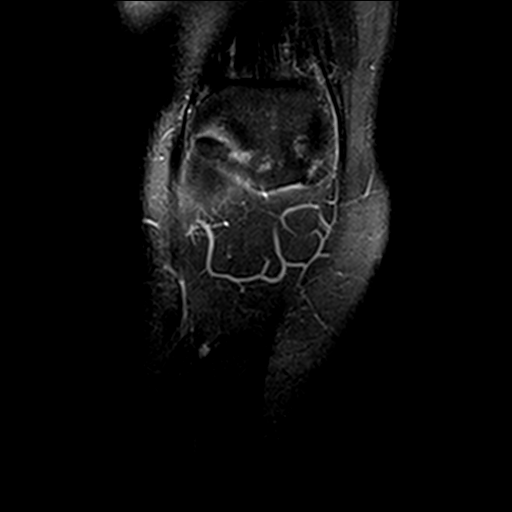
[im 13/22]
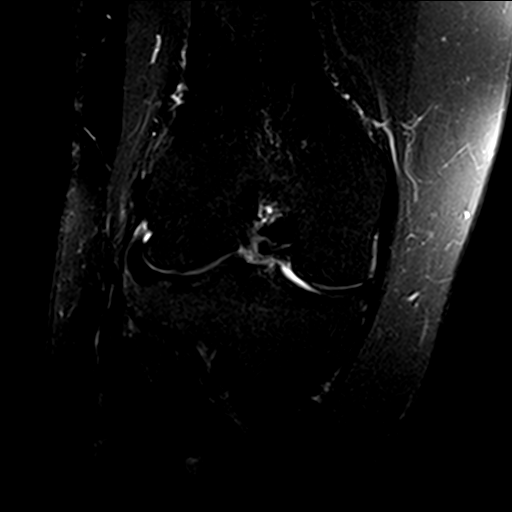
[im 22/22]
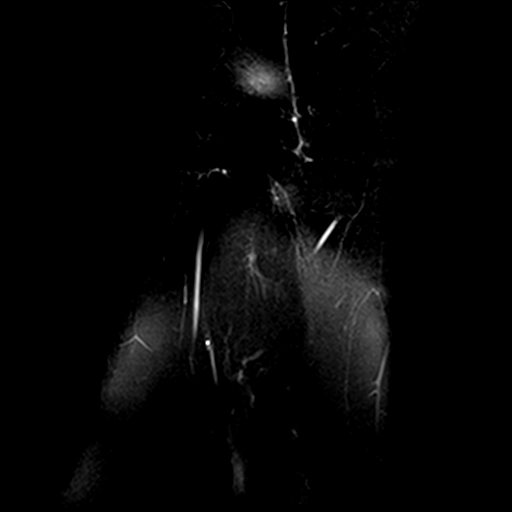

[Series 10: PD fat-sat · sagittal · 3.0mm · 0.29mm/px · 7 of 27 slices shown (1 of 2)]
[im 1/27]
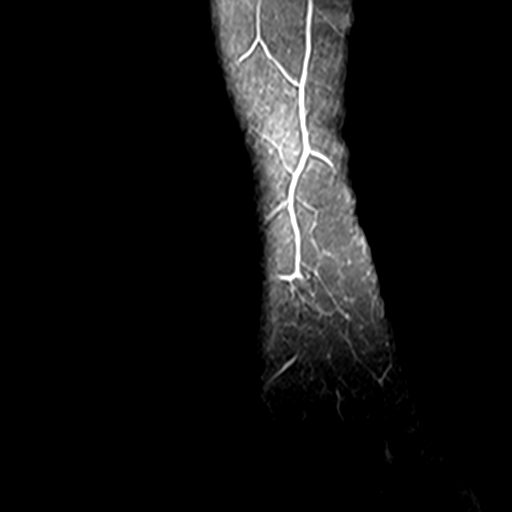
[im 5/27]
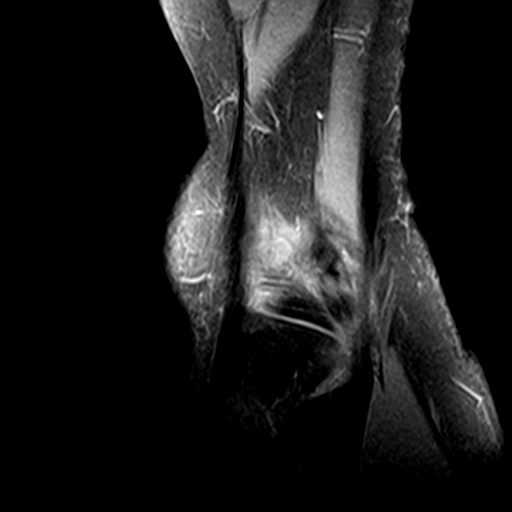
[im 9/27]
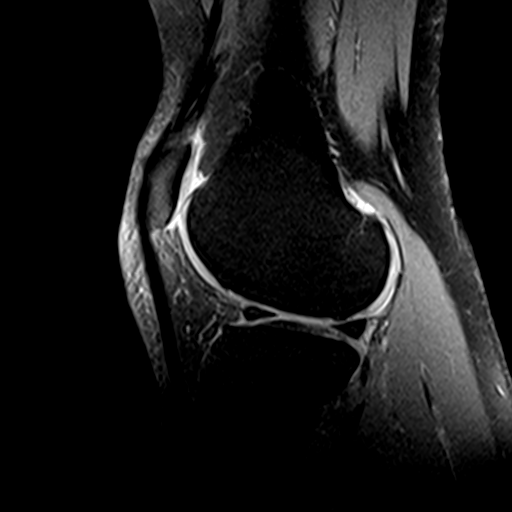
[im 14/27]
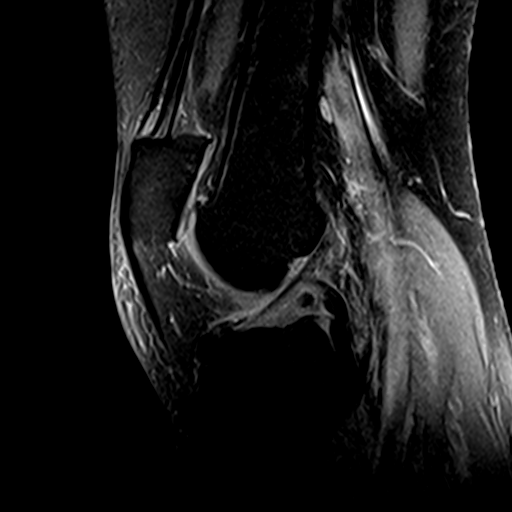
[im 18/27]
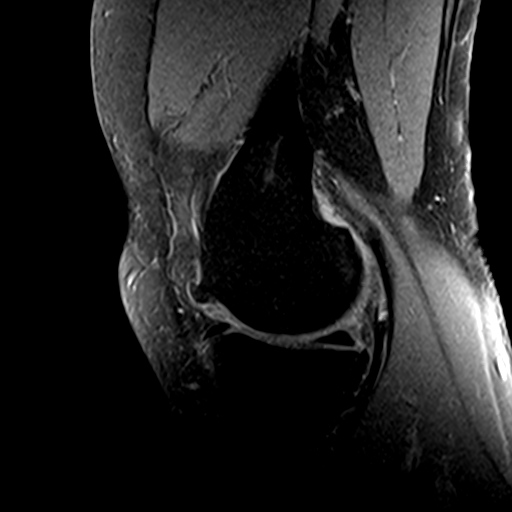
[im 22/27]
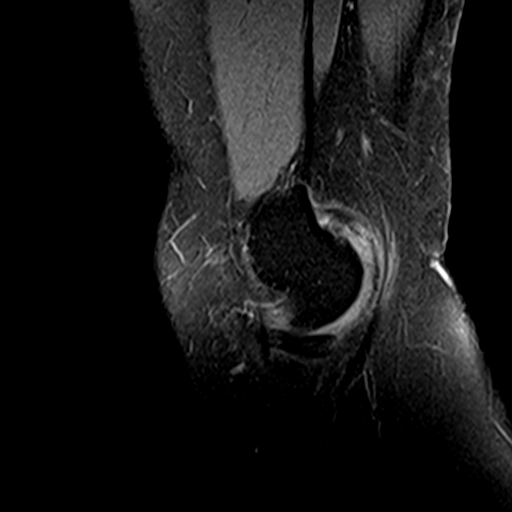
[im 27/27]
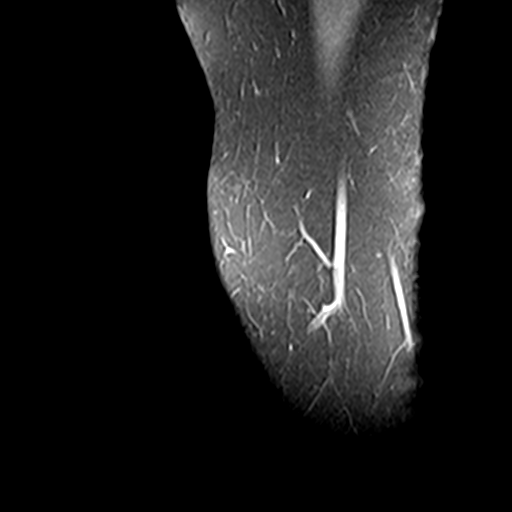

[Series 12: PD fat-sat · coronal · 3.0mm · 0.29mm/px · 7 of 27 slices shown (2 of 2)]
[im 1/27]
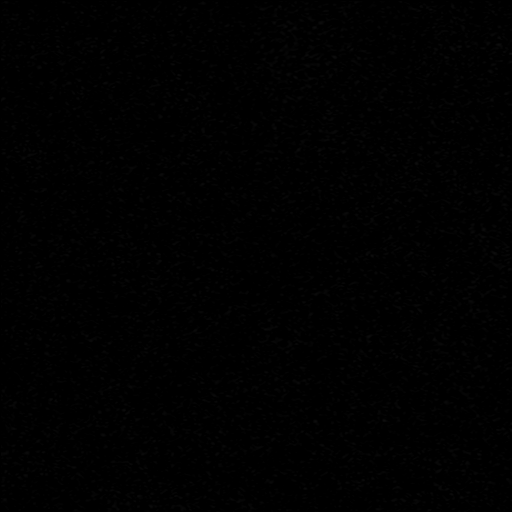
[im 5/27]
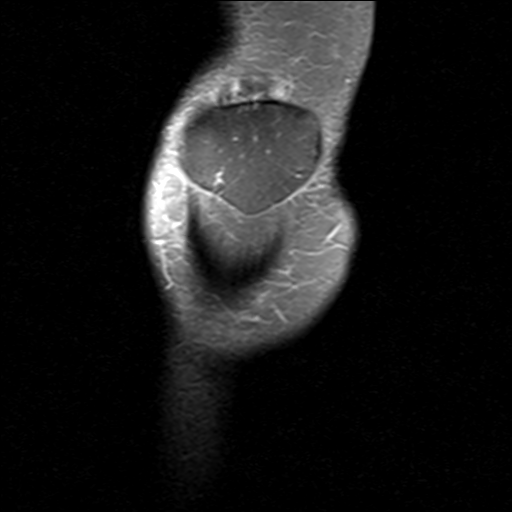
[im 9/27]
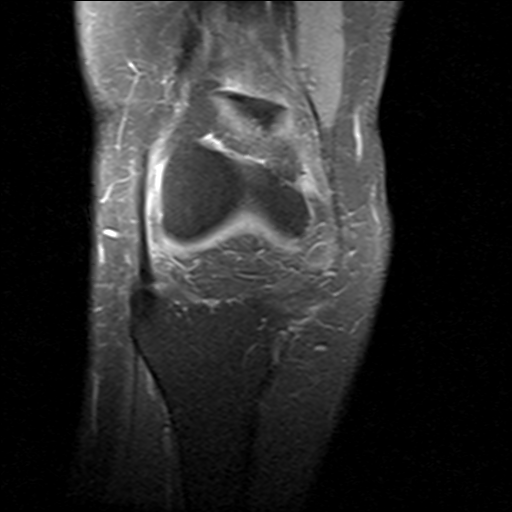
[im 14/27]
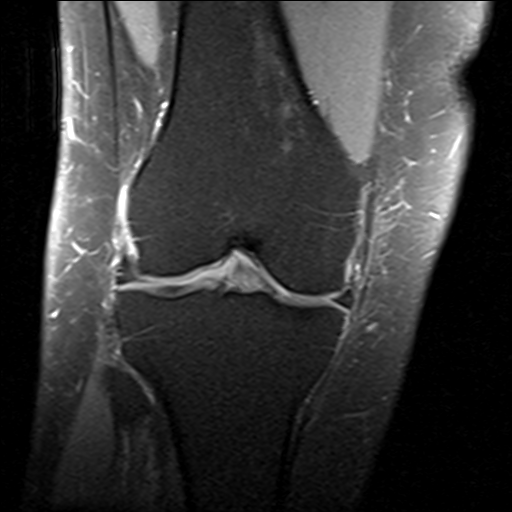
[im 18/27]
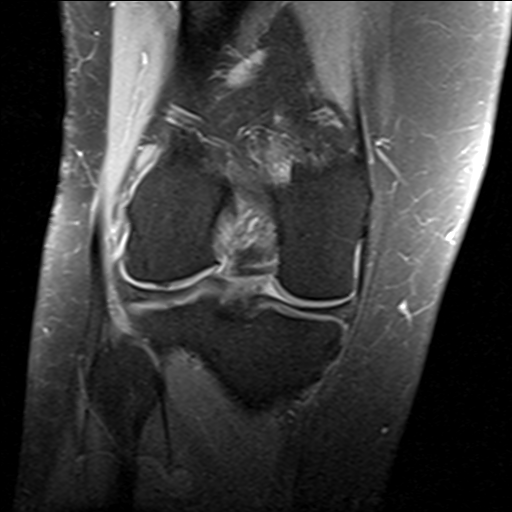
[im 22/27]
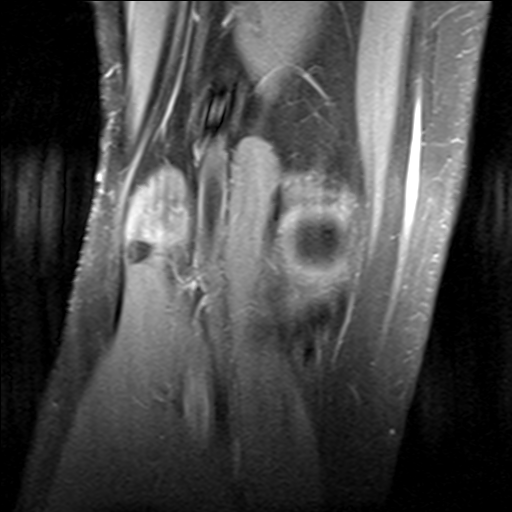
[im 27/27]
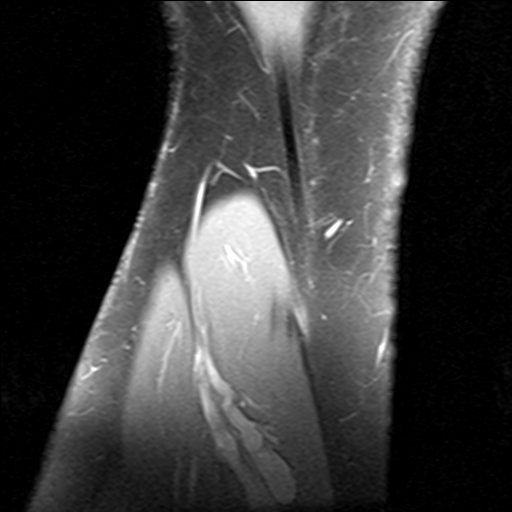

[21 of 40 positions shown; findings below may reference images not displayed]

FINDINGS: MENISCI

Medial meniscus:  Intrasubstance degeneration without discrete tear.

Lateral meniscus: Intrasubstance degeneration without discrete tear.

LIGAMENTS

Cruciates:  Intact ACL and PCL.

Collaterals: Medial collateral ligament is intact. Lateral
collateral ligament complex is intact.

CARTILAGE

Patellofemoral: Full-thickness cartilage loss of the lateral
patellar facet and patellar apex with underlying subchondral marrow
signal changes. Near full-thickness chondral fissure at the superior
aspect of the trochlear groove.

Medial: Partial-thickness chondral ulceration of the medial femoral
condyle near the intercondylar notch (series 8, image 12).

Lateral: Small full-thickness chondral defect involving the
posterior nonweightbearing lateral femoral condyle. Chondral
thinning and surface irregularity of the posterior weight-bearing
lateral femoral condyle.

Joint:  No joint effusion. Normal Hoffa's fat. No plical thickening.

Popliteal Fossa: Tiny amount of fluid within a Baker's cyst. Intact
popliteus tendon.

Extensor Mechanism: Tendinosis with partial-thickness interstitial
tear of the distal quadriceps tendon (series 9, image 13). Intact
patellar tendon.

Bones: Mild tricompartmental osteophytosis. No fracture. No marrow
replacing lesion.

Other: None.
IMPRESSION: 1. Mild-moderate tricompartmental osteoarthritis with areas of
full-thickness cartilage loss in the patellofemoral and lateral
tibiofemoral compartments.
2. Intrasubstance degeneration of the menisci without discrete tear.
3. Tendinosis and partial-thickness interstitial tear of the distal
quadriceps tendon.

## 2019-07-20 ENCOUNTER — Ambulatory Visit (INDEPENDENT_AMBULATORY_CARE_PROVIDER_SITE_OTHER): Payer: Commercial Managed Care - PPO | Admitting: Physician Assistant

## 2019-07-20 ENCOUNTER — Other Ambulatory Visit: Payer: Self-pay

## 2019-07-20 ENCOUNTER — Encounter: Payer: Self-pay | Admitting: Physician Assistant

## 2019-07-20 DIAGNOSIS — M1711 Unilateral primary osteoarthritis, right knee: Secondary | ICD-10-CM

## 2019-07-20 NOTE — Progress Notes (Signed)
Sherri Rodriguez comes in today to review the MRI of her right knee.  She continues to have pain in the knee but overall is feeling a little bit better after bracing her knee off the shelf knee brace.  In she had a cortisone injection in the knee 06/16/19.  MRI right knee dated 07/14/2019 is reviewed with patient.  Images reviewed with patient.  Patellofemoral compartment with full-thickness cartilage loss lateral patella facet and patellar apex.  Also full thickness chondral fissure involving the superior aspect of the trochlear groove.  Medial compartment with partial thickness arthritic changes involving the medial femoral condyle at the area of the intercondylar notch.  Lateral compartment small full-thickness chondral defect involving the posterior nonweightbearing lateral femoral condyle and chondral thinning and surface irregularity of the posterior weightbearing lateral femoral condyle.  Partial interstitial tear distal quadriceps tendon.  Medial meniscus with degenerative intersubstance changes but no frank tear.  Physical exam: Right knee she has full extension of the knee full flexion actively.  Tenderness over the distal quadriceps tendon no palpable defect.  Impression: Right knee tricompartmental arthritic changes mild to moderate  Plan: Discussed with her quad strengthening and knee friendly exercises.  She may benefit from repeat cortisone injection but would need to wait at least 3 months.  Also may consider supplemental injection in the future.  Questions encouraged and answered.  Follow-up as needed.

## 2019-08-09 ENCOUNTER — Other Ambulatory Visit: Payer: Commercial Managed Care - PPO

## 2019-08-15 ENCOUNTER — Ambulatory Visit: Payer: Commercial Managed Care - PPO | Admitting: Physician Assistant

## 2019-10-17 ENCOUNTER — Other Ambulatory Visit: Payer: Self-pay | Admitting: Orthopaedic Surgery

## 2019-10-17 MED ORDER — BACLOFEN 10 MG PO TABS: 10.0000 mg | ORAL_TABLET | Freq: Two times a day (BID) | ORAL | 0 refills | Status: AC | PRN

## 2021-06-13 ENCOUNTER — Ambulatory Visit (INDEPENDENT_AMBULATORY_CARE_PROVIDER_SITE_OTHER): Payer: Commercial Managed Care - PPO

## 2021-06-13 ENCOUNTER — Ambulatory Visit (INDEPENDENT_AMBULATORY_CARE_PROVIDER_SITE_OTHER): Payer: Commercial Managed Care - PPO | Admitting: Physician Assistant

## 2021-06-13 ENCOUNTER — Encounter: Payer: Self-pay | Admitting: Physician Assistant

## 2021-06-13 DIAGNOSIS — M25562 Pain in left knee: Secondary | ICD-10-CM

## 2021-06-13 MED ORDER — METHYLPREDNISOLONE ACETATE 40 MG/ML IJ SUSP
40.0000 mg | INTRAMUSCULAR | Status: AC | PRN
  Administered 2021-06-13: 40 mg via INTRA_ARTICULAR

## 2021-06-13 MED ORDER — LIDOCAINE HCL 1 % IJ SOLN
3.0000 mL | INTRAMUSCULAR | Status: AC | PRN
  Administered 2021-06-13: 3 mL

## 2021-06-13 NOTE — Progress Notes (Signed)
? ?Office Visit Note ?  ?Patient: Sherri Rodriguez           ?Date of Birth: 02-04-1966           ?MRN: [id number] ?Visit Date: 06/13/2021 ?             ?Requested by: Sigmund Hazel, MD ?[address] ?[place],  [place] [address] ?PCP: Sigmund Hazel, MD ? ? ?Assessment & Plan: ?Visit Diagnoses:  ?1. Acute pain of left knee   ? ? ?Plan: Given the multiple loose bodies and the cystic structure in the popliteal fossa region with multiple calcifications recommend MRI to evaluate the loose bodies and the cyst in the back of the knee.  Also to evaluate for possible meniscal tear.  Have her back after the MRI to go over results discuss further treatment.  She will remove the Ace bandage that was applied today prior to going to bed this evening ? ?Follow-Up Instructions: Return After MRI.  ? ?Orders:  ?Orders Placed This Encounter  ?Procedures  ? Large Joint Inj  ? XR Knee 1-2 Views Left  ? ?No orders of the defined types were placed in this encounter. ? ? ? ? Procedures: ?Large Joint Inj: L knee on 06/13/2021 4:24 PM ?Indications: pain ?Details: 22 G 1.5 in needle, superolateral approach ? ?Arthrogram: No ? ?Medications: 3 mL lidocaine 1 %; 40 mg methylPREDNISolone acetate 40 MG/ML ?Aspirate: 23 mL yellow ?Outcome: tolerated well, no immediate complications ?Procedure, treatment alternatives, risks and benefits explained, specific risks discussed. Consent was given by the patient. Immediately prior to procedure a time out was called to verify the correct patient, procedure, equipment, support staff and site/side marked as required. Patient was prepped and draped in the usual sterile fashion.  ? ? ? ? ?Clinical Data: ?No additional findings. ? ? ?Subjective: ?Chief Complaint  ?Patient presents with  ? Left Knee - Pain  ? ? ?HPI ?Sherri Rodriguez comes in today with left knee pain and swelling began last Sunday.  No known injury.  She notes that she cannot bend the knee past 90 degrees without significant pain.  She has been  using an Ace bandage on the knee at all times.  Denies injury to the knee. ?She does note that she has been playing tennis prior to Sunday and felt that she may have overworked the knee.  She has tried heat and ice elevation.  No prior pain in the knee.  Rates her pain to be 2-3 out of 10 pain at worst. She is nondiabetic. ? ? ? ? ?Review of Systems ?See HPI ? ?Objective: ?Vital Signs: There were no vitals taken for this visit. ? ?Physical Exam ?Constitutional:   ?   Appearance: She is normal weight. She is not ill-appearing or diaphoretic.  ?Pulmonary:  ?   Effort: Pulmonary effort is normal.  ?Neurological:  ?   Mental Status: She is alert and oriented to person, place, and time.  ?Psychiatric:     ?   Mood and Affect: Mood normal.  ? ? ?Ortho Exam ?Left knee full extension flexion to 90 degrees and knee flexion 990 degrees causes pain.  Positive effusion no abnormal warmth erythema left knee.  Left knee no instability valgus varus stressing.  McMurray's is negative left knee.  Right knee good range of motion without pain. ?Specialty Comments:  ?No specialty comments available. ? ?Imaging: ?XR Knee 1-2 Views Left ? ?Result Date: 06/13/2021 ?Left knee 2 views: No acute fracture.  Slight narrowing  medial joint line.  Mild patellofemoral changes.  Multiple loose bodies seen in the posterior medial aspect of the knee.  The posterior popliteal fossa region outside the knee envelope cystic-like structure with multiple small calcifications seen.  No other bony abnormalities or lesions.  ? ? ?PMFS History: ?Patient Active Problem List  ? Diagnosis Date Noted  ? Primary osteoarthritis of right knee 07/20/2019  ? ?History reviewed. No pertinent past medical history.  ?History reviewed. No pertinent family history.  ?History reviewed. No pertinent surgical history. ?Social History  ? ?Occupational History  ? Not on file  ?Tobacco Use  ? Smoking status: Not on file  ? Smokeless tobacco: Not on file  ?Substance and Sexual  Activity  ? Alcohol use: Not on file  ? Drug use: Not on file  ? Sexual activity: Not on file  ? ? ? ? ? ? ?

## 2021-06-17 NOTE — Addendum Note (Signed)
Addended by: Robyne Peers on: 06/17/2021 09:05 AM ? ? Modules accepted: Orders ? ?

## 2021-06-24 ENCOUNTER — Ambulatory Visit: Payer: Commercial Managed Care - PPO | Admitting: Physician Assistant

## 2021-06-25 ENCOUNTER — Other Ambulatory Visit: Payer: Commercial Managed Care - PPO

## 2021-06-28 ENCOUNTER — Other Ambulatory Visit: Payer: Self-pay | Admitting: Physician Assistant

## 2021-07-03 ENCOUNTER — Ambulatory Visit: Payer: Commercial Managed Care - PPO | Admitting: Physician Assistant

## 2021-07-04 ENCOUNTER — Ambulatory Visit: Payer: Commercial Managed Care - PPO | Admitting: Physician Assistant

## 2021-07-05 ENCOUNTER — Ambulatory Visit
Admission: RE | Admit: 2021-07-05 | Discharge: 2021-07-05 | Disposition: A | Discharge status: Home / Self Care | Payer: Commercial Managed Care - PPO | Source: Ambulatory Visit | Attending: Physician Assistant | Admitting: Physician Assistant

## 2021-07-05 DIAGNOSIS — M25562 Pain in left knee: Secondary | ICD-10-CM

## 2021-07-05 IMAGING — MR MR KNEE*L* W/O CM
4 of 7 series · 22 of 40 positions shown · non-contrast
Comparison: None.

CLINICAL DATA: Left lateral knee pain for 1 month. No known injury.

EXAM:
MRI OF THE LEFT KNEE WITHOUT CONTRAST
TECHNIQUE: Multiplanar, multisequence MR imaging of the knee was performed. No
intravenous contrast was administered.

[Series 3: T2 fat-sat · axial · 4.0mm · 0.50mm/px · z∈[-71,+44]mm · 5 of 29 slices shown]
[im 1/29]
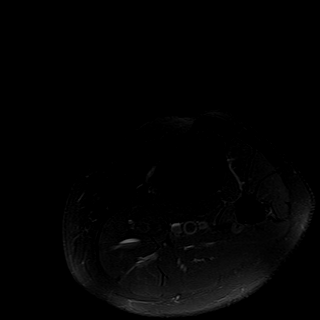
[im 5/29]
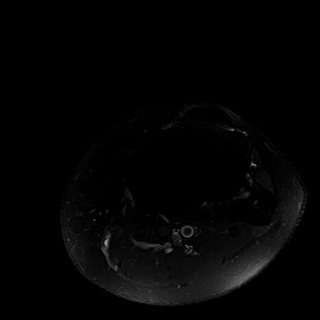
[im 10/29]
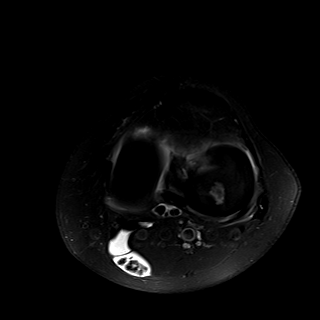
[im 15/29]
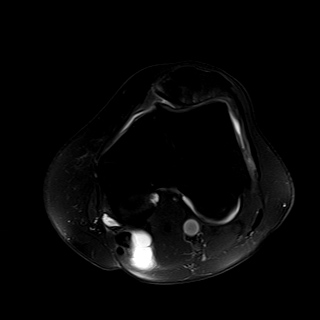
[im 24/29]
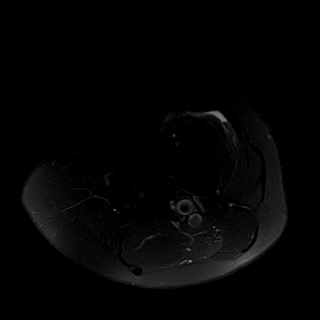

[Series 6: PD fat-sat · coronal · 3.0mm · 0.29mm/px · 7 of 32 slices shown (1 of 3)]
[im 1/32]
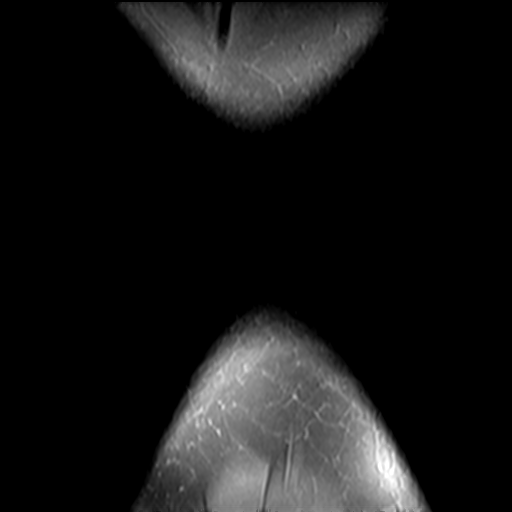
[im 6/32]
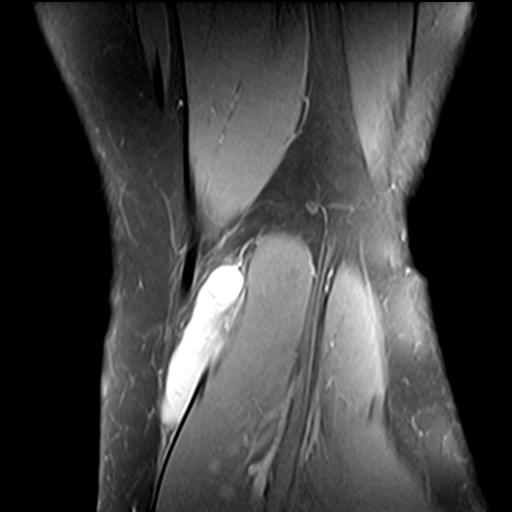
[im 11/32]
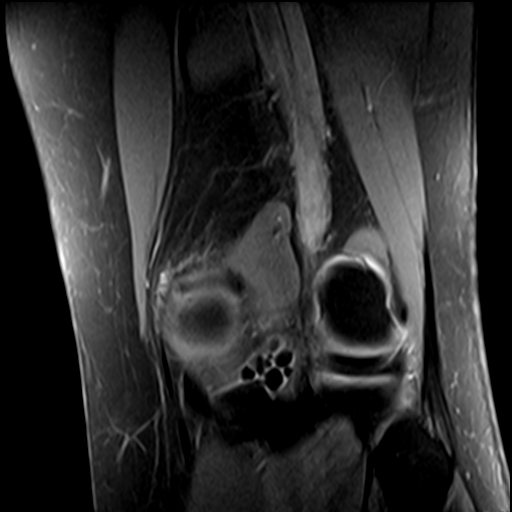
[im 16/32]
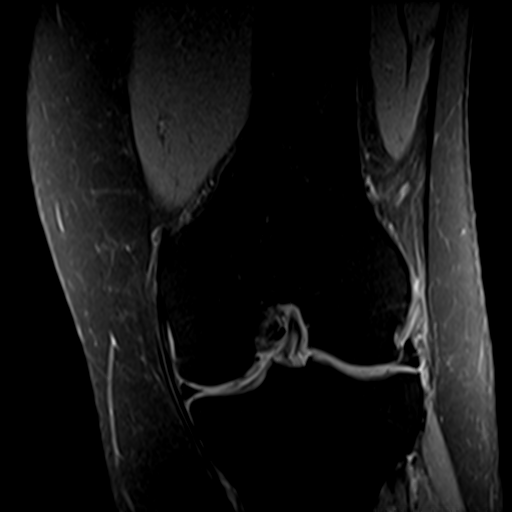
[im 21/32]
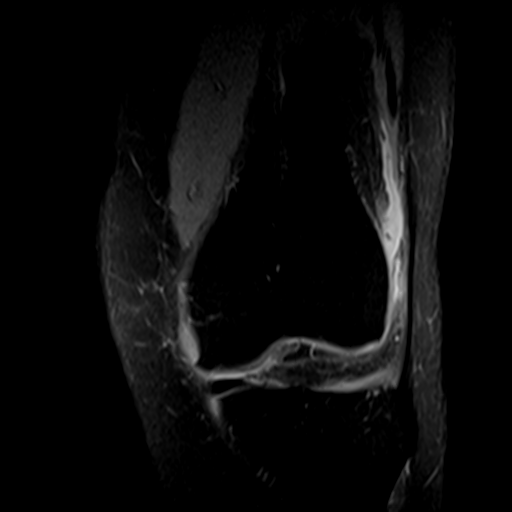
[im 26/32]
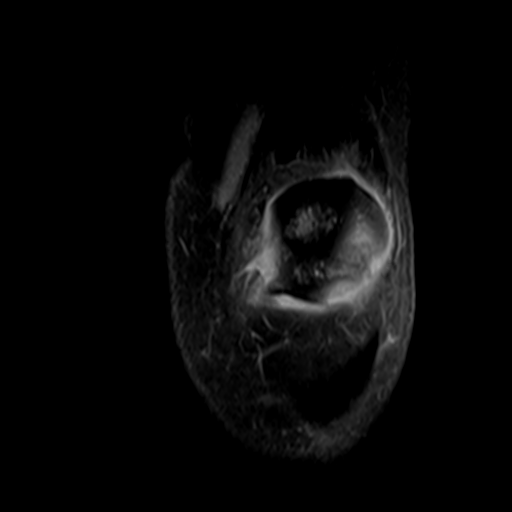
[im 32/32]
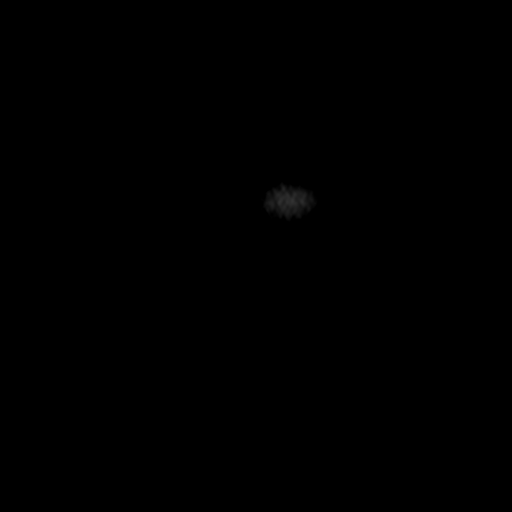

[Series 8: PD fat-sat · sagittal · 3.0mm · 0.29mm/px · 6 of 30 slices shown (2 of 3)]
[im 1/30]
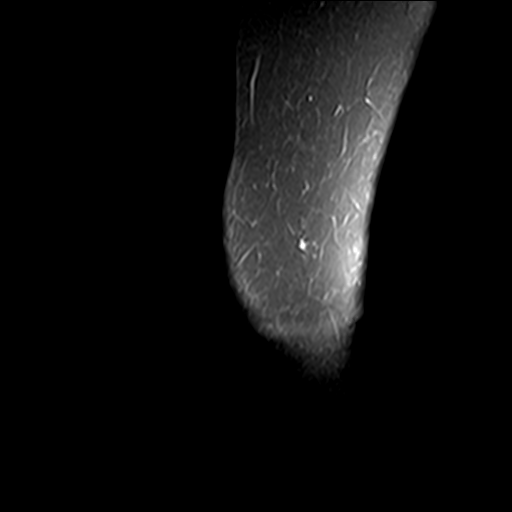
[im 6/30]
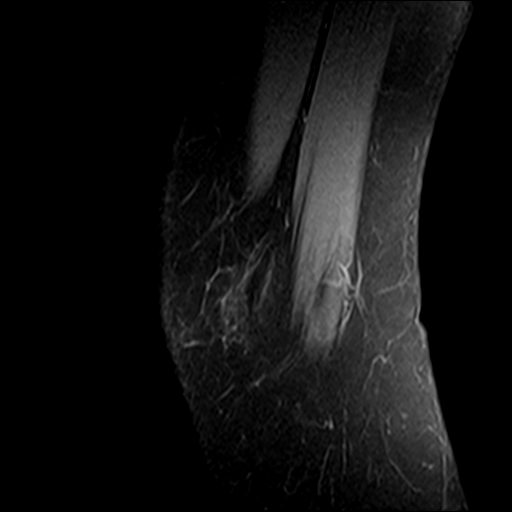
[im 12/30]
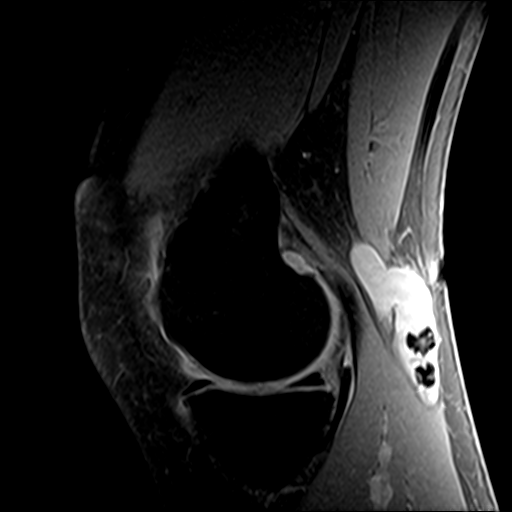
[im 18/30]
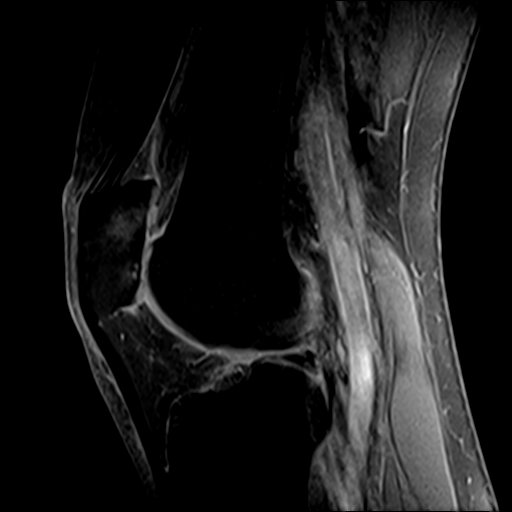
[im 24/30]
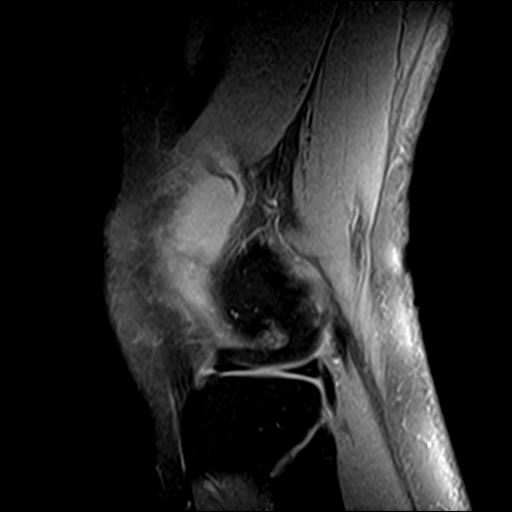
[im 30/30]
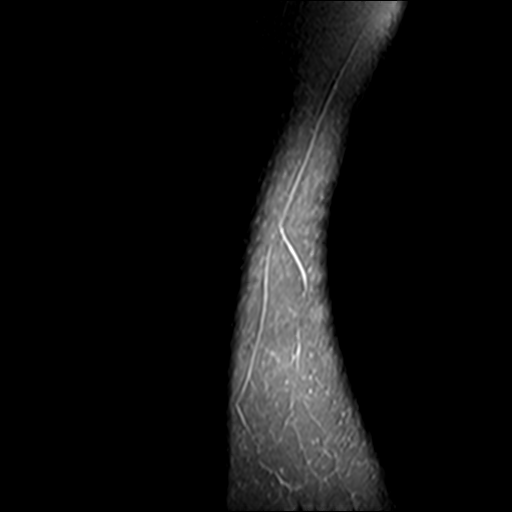

[Series 9: PD fat-sat · coronal · 2.3mm · 0.29mm/px · 4 of 18 slices shown (3 of 3)]
[im 1/18]
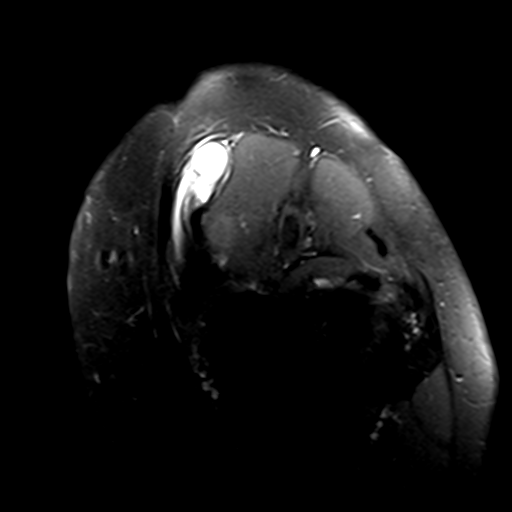
[im 6/18]
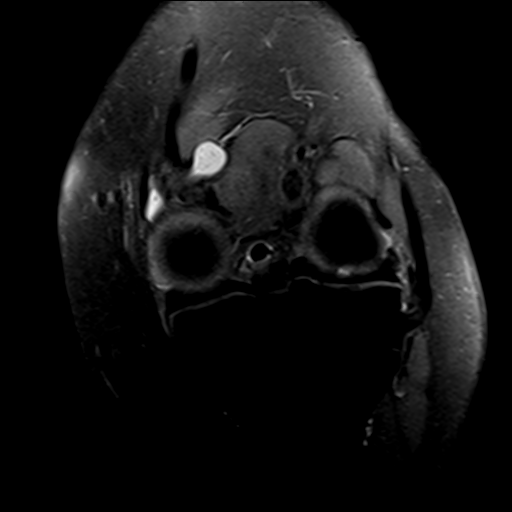
[im 12/18]
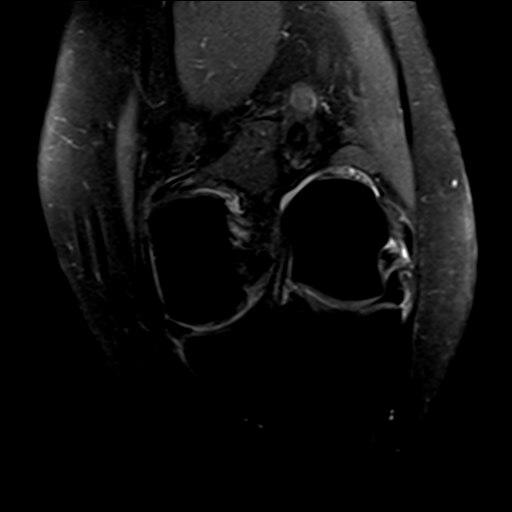
[im 18/18]
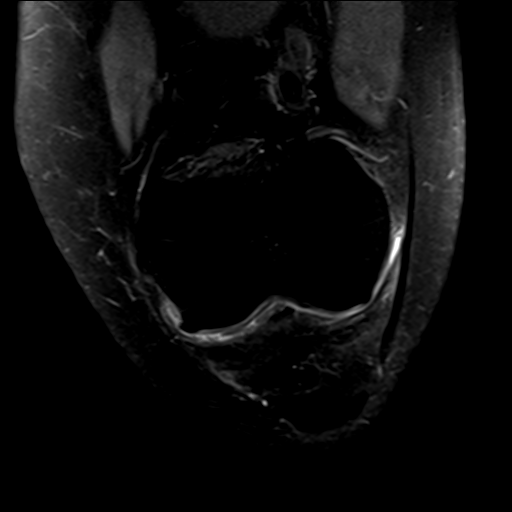

[22 of 40 positions shown; findings below may reference images not displayed]

FINDINGS: MENISCI

Medial: Intact.

Lateral: Intact.

LIGAMENTS

Cruciates: ACL and PCL are intact.

Collaterals: Medial collateral ligament is intact. Lateral
collateral ligament complex is intact.

CARTILAGE

Patellofemoral: High-grade partial-thickness cartilage loss of the
patella with areas of full-thickness cartilage loss of the patellar
apex and lateral patellar facet and subchondral reactive marrow
edema.

Medial: Partial-thickness cartilage loss of the medial femorotibial
compartment.

Lateral: Focal area of full-thickness cartilage loss of the
posterior weight-bearing surface of the lateral femoral condyle.

JOINT: Small joint effusion. Normal SIU TIN. No plical
thickening. Multiple loose bodies posterior to the PCL.

POPLITEAL FOSSA: Popliteus tendon is intact. Small Baker's cyst.
Multiple loose bodies in the Baker's cyst.

EXTENSOR MECHANISM: Intact quadriceps tendon. Intact patellar
tendon. Intact lateral patellar retinaculum. Intact medial patellar
retinaculum. Intact MPFL.

BONES: No aggressive osseous lesion. No fracture or dislocation.

Other: No fluid collection or hematoma. Muscles are normal.
IMPRESSION: 1. Tricompartmental cartilage abnormalities as described above most
severe in the patellofemoral compartment. Multiple intra-articular
loose bodies.
2. No meniscal or ligamentous injury of the left knee.

## 2021-07-10 ENCOUNTER — Encounter: Payer: Self-pay | Admitting: Physician Assistant

## 2021-07-10 ENCOUNTER — Ambulatory Visit (INDEPENDENT_AMBULATORY_CARE_PROVIDER_SITE_OTHER): Payer: Commercial Managed Care - PPO | Admitting: Physician Assistant

## 2021-07-10 DIAGNOSIS — M1712 Unilateral primary osteoarthritis, left knee: Secondary | ICD-10-CM

## 2021-07-10 NOTE — Progress Notes (Signed)
HPI: Mrs. Sherri Rodriguez returns today to go over the MRI of her left knee.  She states overall knee has been feeling better.  She notes some swelling in the knee.  She does feel a cortisone injection was helpful.  She has had no catching or locking in the knee. ?MRI is reviewed images reviewed with patient.  MRI shows tricompartmental arthritis with high-grade partial-thickness cartilage loss in the patellofemoral joint with areas of full-thickness cartilage loss.  Also areas of bony marrow edema noted.  Lateral compartment focal areas of full-thickness cartilage loss involving the posterior weightbearing surface of the lateral femoral condyle.  Medial compartment with partial-thickness cartilage loss.  Multiple loose bodies seen within the body posterior to the PCL.  Also multiple loose bodies were seen Baker's cyst popliteal fossa.  No meniscal tears.   ? ?Impression: Tricompartmental arthritis left knee ? ?Plan: Discussed with the patient at length today quad strengthening exercises.  Also knee friendly exercises.  Discussed cortisone injections versus supplemental injections.  Due to the fact that she is having no significant pain in the knee at this point time will recommend strengthening and knee exercises.  She did ask about back pain that she is having with some radicular symptoms down the right leg.  She is given handouts on back exercises.  She does state that her back pain and radicular symptoms have gotten better since acquiring a new mattress though.  She will follow-up with [place] as needed.  She understands to wait least 3 months between cortisone injections left knee. ?

## 2022-05-15 ENCOUNTER — Encounter: Payer: Self-pay | Admitting: Radiology

## 2022-05-16 ENCOUNTER — Other Ambulatory Visit: Payer: Self-pay | Admitting: Family Medicine

## 2022-05-16 DIAGNOSIS — D7389 Other diseases of spleen: Secondary | ICD-10-CM

## 2022-05-16 DIAGNOSIS — E041 Nontoxic single thyroid nodule: Secondary | ICD-10-CM

## 2022-07-09 ENCOUNTER — Ambulatory Visit
Admission: RE | Admit: 2022-07-09 | Discharge: 2022-07-09 | Disposition: A | Discharge status: Home / Self Care | Payer: BC Managed Care – PPO | Source: Ambulatory Visit | Attending: Family Medicine | Admitting: Family Medicine

## 2022-07-09 ENCOUNTER — Ambulatory Visit
Admission: RE | Admit: 2022-07-09 | Discharge: 2022-07-09 | Disposition: A | Discharge status: Home / Self Care | Payer: Self-pay | Source: Ambulatory Visit | Attending: Family Medicine | Admitting: Family Medicine

## 2022-07-09 DIAGNOSIS — D7389 Other diseases of spleen: Secondary | ICD-10-CM

## 2022-07-09 DIAGNOSIS — E041 Nontoxic single thyroid nodule: Secondary | ICD-10-CM

## 2023-01-12 ENCOUNTER — Other Ambulatory Visit: Payer: Self-pay | Admitting: Pain Medicine

## 2023-01-12 DIAGNOSIS — Z780 Asymptomatic menopausal state: Secondary | ICD-10-CM

## 2023-08-21 NOTE — Progress Notes (Unsigned)
    Ben Jackson D.[facility] [address] [place] [address] Phone: [phone]   Assessment and Plan:     There are no diagnoses linked to this encounter.  ***   Pertinent previous records reviewed include ***    Follow Up: ***     Subjective:    Chief Complaint: left hip pain  HPI:   08/21/23 Patient is a 58 year old female with complaint of left hip pain. Patient states   Duration? Did you have an Injury to cause this pain? Taking Medication for pain? Numbness or Tingling? Does the pain Radiate?  Altered gait or use? ROM/ impairment of movement?   Relevant Historical Information: ***  Additional pertinent review of systems negative.   Current Outpatient Medications:    baclofen  (LIORESAL ) 10 MG tablet, Take 1 tablet (10 mg total) by mouth 2 (two) times daily as needed for muscle spasms., Disp: 30 each, Rfl: 0   Objective:     There were no vitals filed for this visit.    There is no height or weight on file to calculate BMI.    Physical Exam:    ***   Electronically signed by:  Marshall Skeeter D.[facility] 11:14 AM 08/21/23

## 2023-08-24 ENCOUNTER — Ambulatory Visit: Payer: Self-pay | Admitting: Sports Medicine

## 2023-08-24 ENCOUNTER — Ambulatory Visit (INDEPENDENT_AMBULATORY_CARE_PROVIDER_SITE_OTHER)

## 2023-08-24 VITALS — BP 120/60 | HR 86 | Ht 63.39 in | Wt 134.0 lb

## 2023-08-24 DIAGNOSIS — M1612 Unilateral primary osteoarthritis, left hip: Secondary | ICD-10-CM | POA: Diagnosis not present

## 2023-08-24 DIAGNOSIS — M25552 Pain in left hip: Secondary | ICD-10-CM

## 2023-08-24 DIAGNOSIS — G8929 Other chronic pain: Secondary | ICD-10-CM

## 2023-08-24 MED ORDER — MELOXICAM 15 MG PO TABS: 15.0000 mg | ORAL_TABLET | Freq: Every day | ORAL | 0 refills | Status: AC

## 2023-08-24 NOTE — Patient Instructions (Signed)
-   Start meloxicam 15 mg daily x2 weeks.  If still having pain after 2 weeks, complete 3rd-week of NSAID. May use remaining NSAID as needed once daily for pain control.  Do not to use additional over-the-counter NSAIDs (ibuprofen, naproxen, Advil, Aleve, etc.) while taking prescription NSAIDs.  May use Tylenol [phone] mg 2 to 3 times a day for breakthrough pain.  Referral to Sarah at [facility]. Follow up in 4 to 6 weeks.

## 2023-08-25 ENCOUNTER — Ambulatory Visit: Payer: Self-pay | Admitting: Sports Medicine

## 2023-08-27 NOTE — Therapy (Signed)
 OUTPATIENT PHYSICAL THERAPY LOWER EXTREMITY EVALUATION   Patient Name: Sherri Rodriguez MRN: [id number] DOB:[birth year], 58 y.o., female Today's Date: 08/28/2023  END OF SESSION:  PT End of Session - 08/28/23 0813     Visit Number 1    Number of Visits 8    Date for PT Re-Evaluation 10/23/23    Authorization Type [employer]    PT Start Time 0813    PT Stop Time 0847    PT Time Calculation (min) 34 min    Activity Tolerance Patient tolerated treatment well    Behavior During Therapy [facility] for tasks assessed/performed          History reviewed. No pertinent past medical history. History reviewed. No pertinent surgical history. Patient Active Problem List   Diagnosis Date Noted   Primary osteoarthritis of right knee 07/20/2019    PCP: Perley Bradley, MD   REFERRING PROVIDER: Ulysees Gander, DO   REFERRING DIAG:  [phone] (ICD-10-CM) - Chronic left hip pain  M16.12 (ICD-10-CM) - Primary osteoarthritis of left hip    THERAPY DIAG:  Pain in left hip  Stiffness of left hip, not elsewhere classified  Rationale for Evaluation and Treatment: Rehabilitation  ONSET DATE: months  SUBJECTIVE:   SUBJECTIVE STATEMENT: In the morning the pain is the worse. Meloxicam is helping.Feels in the groin and into ITB. Once I start moving it gets better.  Sit to stand hurts. Has to pause before walking. Stretching doesn't really help.  PERTINENT HISTORY: moderate femoral acetabular osteoarthritis  PAIN:  Are you having pain? Yes: NPRS scale: 5/10 Pain location: L groin and into ITB Pain description: catch/strain  Aggravating factors: sit to stand, initial walking, mornings Relieving factors: Meloxicam, moving  PRECAUTIONS: None  RED FLAGS: None   WEIGHT BEARING RESTRICTIONS: No  FALLS:  Has patient fallen in last 6 months? No  LIVING ENVIRONMENT: Stairs are no problem  OCCUPATION: Desk work  PLOF: Independent and Leisure: plays tennis (fine)  PATIENT GOALS:  get past the flare up, strengthen the area  NEXT MD VISIT: end of July  OBJECTIVE:  Note: Objective measures were completed at Evaluation unless otherwise noted.  DIAGNOSTIC FINDINGS:  Moderate degenerative changes in the L femoral acetabular joint  PATIENT SURVEYS:  LEFS  Extreme difficulty/unable (0), Quite a bit of difficulty (1), Moderate difficulty (2), Little difficulty (3), No difficulty (4) Survey date:    Any of your usual work, housework or school activities 4  2. Usual hobbies, recreational or sporting activities 4  3. Getting into/out of the bath 3  4. Walking between rooms 4  5. Putting on socks/shoes 4  6. Squatting  4  7. Lifting an object, like a bag of groceries from the floor 4  8. Performing light activities around your home 4  9. Performing heavy activities around your home 4  10. Getting into/out of a car 2  11. Walking 2 blocks 4  12. Walking 1 mile 4  13. Going up/down 10 stairs (1 flight) 4  14. Standing for 1 hour 4  15.  sitting for 1 hour 3  16. Running on even ground 4  17. Running on uneven ground 4  18. Making sharp turns while running fast 4  19. Hopping  4  20. Rolling over in bed 3  Score total:  75     COGNITION: Overall cognitive status: Within functional limits for tasks assessed     SENSATION: WFL   MUSCLE LENGTH: B HS R>L, L hip flexor and  pirformis tightness  POSTURE: No Significant postural limitations  PALPATION: L glute med,  LOWER EXTREMITY ROM:  Active ROM Right eval Left eval  Hip flexion 120 100  Hip extension    Hip abduction 38 40  Hip adduction    Hip internal rotation    Hip external rotation    Knee flexion    Knee extension    Ankle dorsiflexion    Ankle plantarflexion    Ankle inversion    Ankle eversion     (Blank rows = not tested)  LOWER EXTREMITY MMT: grossly 5/5 B hips; weakness in core with MMT to hip flexors  LOWER EXTREMITY SPECIAL TESTS:  Hip special tests: Portia Brittle (FABER) test:  positive  and FADIR positive  FUNCTIONAL TESTS:  5 times sit to stand: 9.32 seconds feels it in groin                                                                                                                              TREATMENT DATE:   08/28/23  See pt ed and HEP   PATIENT EDUCATION:  Education details: PT eval findings, anticipated POC, initial HEP, and long leg distraction for home   Person educated: Patient Education method: Explanation, Demonstration, Tactile cues, Verbal cues, and Handouts Education comprehension: verbalized understanding and returned demonstration  HOME EXERCISE PROGRAM: Access Code: [email or web address] URL: [email or web address] Date: 08/28/2023 Prepared by: Concha Deed  Exercises - Supine Quadriceps Stretch with Strap on Table  - 2 x daily - 7 x weekly - 1 sets - 3 reps - 30 sec hold - Supine Hamstring Stretch with Strap  - 2 x daily - 7 x weekly - 2 sets - 3 reps - 30 sec hold - Supine Figure 4 Piriformis Stretch  - 2 x daily - 7 x weekly - 1 sets - 3 reps - 30 sec hold  ASSESSMENT:  CLINICAL IMPRESSION: Patient is a 58 y.o. female who was seen today for physical therapy evaluation and treatment for chronic left hip pain. She reports pain with sit to stand, getting in/out of car and walking (initially). Pain is worse in the mornings. She demonstrates deficits in left hip ROM and flexibility and in core strength She also reports weakness in her B LE with functional activities including squats. She will will benefit from skilled PT to address these deficits.    OBJECTIVE IMPAIRMENTS: decreased activity tolerance, difficulty walking, decreased ROM, decreased strength, increased muscle spasms, impaired flexibility, and pain.   ACTIVITY LIMITATIONS: sitting, squatting, bed mobility, locomotion level, and getting in/out of car  PARTICIPATION LIMITATIONS: limited when pain present  PERSONAL FACTORS: 1-2 comorbidities: L hip OA and knee pain are also  affecting patient's functional outcome.   REHAB POTENTIAL: Good  CLINICAL DECISION MAKING: Stable/uncomplicated  EVALUATION COMPLEXITY: Low   GOALS: Goals reviewed with patient? Yes  SHORT TERM GOALS: Target date: 09/25/2023   Patient will be independent with initial HEP. Baseline:  Goal status: INITIAL  2.  Decreased hip pain by 30% with car transfers and sit to stands Baseline:  Goal status: INITIAL   LONG TERM GOALS: Target date: 10/23/2023   Patient will be independent with advanced/ongoing HEP to improve outcomes and carryover.  Baseline:  Goal status: INITIAL  2.  Patient will report decreased pain by 75% in the left hip with sit to stands, car transfers and ambulation. Baseline:  Goal status: INITIAL  3.  Patient will demonstrate improved left hip flexion to 110 deg to improve ability to squat lower and step onto higher surfaces. Baseline: 100 Goal status: INITIAL   PLAN:  PT FREQUENCY: 1x/week  PT DURATION: 8 weeks  PLANNED INTERVENTIONS: [address]- PT Re-evaluation, 97110-Therapeutic exercises, 97530- Therapeutic activity, 97112- Neuromuscular re-education, 97535- Self Care, 60454- Manual therapy, [id number]- Electrical stimulation (unattended), 20560 (1-2 muscles), 20561 (3+ muscles)- Dry Needling, Patient/Family education, Joint mobilization, Spinal mobilization, Cryotherapy, and Moist heat  PLAN FOR NEXT SESSION: Work on L hip mobs/distraction to increase ROM, STM to glute med, hip flexibility and functional strengthening/squats and lunges   Jinx Mourning, PT 08/28/23 9:09 AM

## 2023-08-28 ENCOUNTER — Ambulatory Visit: Attending: Sports Medicine | Admitting: Physical Therapy

## 2023-08-28 ENCOUNTER — Other Ambulatory Visit: Payer: Self-pay

## 2023-08-28 ENCOUNTER — Encounter: Payer: Self-pay | Admitting: Physical Therapy

## 2023-08-28 DIAGNOSIS — G8929 Other chronic pain: Secondary | ICD-10-CM | POA: Insufficient documentation

## 2023-08-28 DIAGNOSIS — M25552 Pain in left hip: Secondary | ICD-10-CM | POA: Insufficient documentation

## 2023-08-28 DIAGNOSIS — M25652 Stiffness of left hip, not elsewhere classified: Secondary | ICD-10-CM | POA: Diagnosis present

## 2023-08-28 DIAGNOSIS — M1612 Unilateral primary osteoarthritis, left hip: Secondary | ICD-10-CM | POA: Diagnosis not present

## 2023-09-03 ENCOUNTER — Other Ambulatory Visit: Payer: BC Managed Care – PPO

## 2023-09-04 ENCOUNTER — Encounter: Payer: Self-pay | Admitting: Physical Therapy

## 2023-09-04 ENCOUNTER — Ambulatory Visit: Admitting: Physical Therapy

## 2023-09-04 DIAGNOSIS — M25652 Stiffness of left hip, not elsewhere classified: Secondary | ICD-10-CM

## 2023-09-04 DIAGNOSIS — M25552 Pain in left hip: Secondary | ICD-10-CM | POA: Diagnosis not present

## 2023-09-04 NOTE — Therapy (Signed)
 OUTPATIENT PHYSICAL THERAPY LOWER EXTREMITY TREATMENT   Patient Name: Sherri Rodriguez MRN: [id number] DOB:09/27/1965, 58 y.o., female Today's Date: 09/04/2023  END OF SESSION:  PT End of Session - 09/04/23 0938     Visit Number 2    Number of Visits 8    Date for PT Re-Evaluation 10/23/23    Authorization Type [employer]    PT Start Time [id number]    PT Stop Time 0932    PT Time Calculation (min) 39 min    Activity Tolerance Patient tolerated treatment well    Behavior During Therapy [facility] for tasks assessed/performed           History reviewed. No pertinent past medical history. History reviewed. No pertinent surgical history. Patient Active Problem List   Diagnosis Date Noted   Primary osteoarthritis of right knee 07/20/2019    PCP: Cleotilde Planas, MD   REFERRING PROVIDER: Leonce Katz, DO   REFERRING DIAG:  [phone] (ICD-10-CM) - Chronic left hip pain  M16.12 (ICD-10-CM) - Primary osteoarthritis of left hip    THERAPY DIAG:  Pain in left hip  Stiffness of left hip, not elsewhere classified  Rationale for Evaluation and Treatment: Rehabilitation  ONSET DATE: months  SUBJECTIVE:   SUBJECTIVE STATEMENT: Patient reports she is doing good today. She is not currently having any pain. She has done done the exercises because she has not had any pain.  From Eval: In the morning the pain is the worse. Meloxicam is helping.Feels in the groin and into ITB. Once I start moving it gets better.  Sit to stand hurts. Has to pause before walking. Stretching doesn't really help.  PERTINENT HISTORY: moderate femoral acetabular osteoarthritis  PAIN:  Are you having pain? Yes: NPRS scale: 5/10 Pain location: L groin and into ITB Pain description: catch/strain  Aggravating factors: sit to stand, initial walking, mornings Relieving factors: Meloxicam, moving  PRECAUTIONS: None  RED FLAGS: None   WEIGHT BEARING RESTRICTIONS: No  FALLS:  Has patient fallen in  last 6 months? No  LIVING ENVIRONMENT: Stairs are no problem  OCCUPATION: Desk work  PLOF: Independent and Leisure: plays tennis (fine)  PATIENT GOALS: get past the flare up, strengthen the area  NEXT MD VISIT: end of July  OBJECTIVE:  Note: Objective measures were completed at Evaluation unless otherwise noted.  DIAGNOSTIC FINDINGS:  Moderate degenerative changes in the L femoral acetabular joint  PATIENT SURVEYS:  LEFS  Extreme difficulty/unable (0), Quite a bit of difficulty (1), Moderate difficulty (2), Little difficulty (3), No difficulty (4) Survey date:    Any of your usual work, housework or school activities 4  2. Usual hobbies, recreational or sporting activities 4  3. Getting into/out of the bath 3  4. Walking between rooms 4  5. Putting on socks/shoes 4  6. Squatting  4  7. Lifting an object, like a bag of groceries from the floor 4  8. Performing light activities around your home 4  9. Performing heavy activities around your home 4  10. Getting into/out of a car 2  11. Walking 2 blocks 4  12. Walking 1 mile 4  13. Going up/down 10 stairs (1 flight) 4  14. Standing for 1 hour 4  15.  sitting for 1 hour 3  16. Running on even ground 4  17. Running on uneven ground 4  18. Making sharp turns while running fast 4  19. Hopping  4  20. Rolling over in bed 3  Score total:  75  COGNITION: Overall cognitive status: Within functional limits for tasks assessed     SENSATION: WFL   MUSCLE LENGTH: B HS R>L, L hip flexor and pirformis tightness  POSTURE: No Significant postural limitations  PALPATION: L glute med,  LOWER EXTREMITY ROM:  Active ROM Right eval Left eval  Hip flexion 120 100  Hip extension    Hip abduction 38 40  Hip adduction    Hip internal rotation    Hip external rotation    Knee flexion    Knee extension    Ankle dorsiflexion    Ankle plantarflexion    Ankle inversion    Ankle eversion     (Blank rows = not  tested)  LOWER EXTREMITY MMT: grossly 5/5 B hips; weakness in core with MMT to hip flexors  LOWER EXTREMITY SPECIAL TESTS:  Hip special tests: Belvie (FABER) test: positive  and FADIR positive  FUNCTIONAL TESTS:  5 times sit to stand: 9.32 seconds feels it in groin                                                                                                                              TREATMENT DATE:   09/04/2023 Supine hamstring stretch with green strap bilateral  Supine Quad Stretch bilateral 2 x 30 bilateral  Supine piriformis stretch 2 x 30 sec bilateral  Hooklying PPT 2 x 10 Hooklying TA activation + hip abduction with red loop 2 x 10 Hooklying TA activation + hip adduction ball squeeze 2 x 10 Supine Bridge 2 x 10 TA activation + SLR 2 x 10 bilateral  Sit to Stand with 7# DB  x 10  Clamshells with green TB x 10 bilateral  Reverse clamshell with green x 10 bilateral    08/28/23  See pt ed and HEP   PATIENT EDUCATION:  Education details: PT eval findings, anticipated POC, initial HEP, and long leg distraction for home   Person educated: Patient Education method: Explanation, Demonstration, Tactile cues, Verbal cues, and Handouts Education comprehension: verbalized understanding and returned demonstration  HOME EXERCISE PROGRAM: Access Code: [email or web address] URL: [email or web address] Date: 09/04/2023 Prepared by: Kristeen Sar  Exercises - Supine Quadriceps Stretch with Strap on Table  - 2 x daily - 7 x weekly - 1 sets - 3 reps - 30 sec hold - Supine Hamstring Stretch with Strap  - 2 x daily - 7 x weekly - 2 sets - 3 reps - 30 sec hold - Supine Figure 4 Piriformis Stretch  - 2 x daily - 7 x weekly - 1 sets - 3 reps - 30 sec hold - Supine Transversus Abdominis Bracing - Hands on Stomach  - 1 x daily - 7 x weekly - 2 sets - 10 reps - Hooklying Clamshell with Resistance  - 1 x daily - 7 x weekly - 2 sets - 10 reps - Supine Hip Adduction Isometric with Ball  - 1 x  daily - 7 x weekly -  2 sets - 10 reps - Supine Pelvic Tilt with Straight Leg Raise  - 1 x daily - 7 x weekly - 2 sets - 10 reps - Supine Bridge  - 1 x daily - 7 x weekly - 2 sets - 10 reps - Supine 90/90 Alternating Heel Touches with Posterior Pelvic Tilt  - 1 x daily - 7 x weekly - 2 sets - 10 reps - Clamshell with Resistance  - 1 x daily - 7 x weekly - 1-2 sets - 10 reps - Sidelying Reverse Clamshell  - 1 x daily - 7 x weekly - 1-2 sets - 10 reps ASSESSMENT:  CLINICAL IMPRESSION: [place] presents to first follow up appointment since evaluation. Since did not verbalize any pain today. She has not done HEP exercises due to her not having pain. Today's treatment session focused on core activation with hip hip movements. Patient required verbal and tactile cues for correct performance and breathing technique. Updated HEP to include exercise progressions. Patient will benefit from skilled PT to address the below impairments and improve overall function.   OBJECTIVE IMPAIRMENTS: decreased activity tolerance, difficulty walking, decreased ROM, decreased strength, increased muscle spasms, impaired flexibility, and pain.   ACTIVITY LIMITATIONS: sitting, squatting, bed mobility, locomotion level, and getting in/out of car  PARTICIPATION LIMITATIONS: limited when pain present  PERSONAL FACTORS: 1-2 comorbidities: L hip OA and knee pain are also affecting patient's functional outcome.   REHAB POTENTIAL: Good  CLINICAL DECISION MAKING: Stable/uncomplicated  EVALUATION COMPLEXITY: Low   GOALS: Goals reviewed with patient? Yes  SHORT TERM GOALS: Target date: 09/25/2023   Patient will be independent with initial HEP. Baseline:  Goal status: INITIAL  2.  Decreased hip pain by 30% with car transfers and sit to stands Baseline:  Goal status: INITIAL   LONG TERM GOALS: Target date: 10/23/2023   Patient will be independent with advanced/ongoing HEP to improve outcomes and carryover.   Baseline:  Goal status: INITIAL  2.  Patient will report decreased pain by 75% in the left hip with sit to stands, car transfers and ambulation. Baseline:  Goal status: INITIAL  3.  Patient will demonstrate improved left hip flexion to 110 deg to improve ability to squat lower and step onto higher surfaces. Baseline: 100 Goal status: INITIAL   PLAN:  PT FREQUENCY: 1x/week  PT DURATION: 8 weeks  PLANNED INTERVENTIONS: 97164- PT Re-evaluation, 97110-Therapeutic exercises, 97530- Therapeutic activity, [id number]- Neuromuscular re-education, 97535- Self Care, [id number]- Manual therapy, G0283- Electrical stimulation (unattended), 20560 (1-2 muscles), 20561 (3+ muscles)- Dry Needling, Patient/Family education, Joint mobilization, Spinal mobilization, Cryotherapy, and Moist heat  PLAN FOR NEXT SESSION: assess response to treatment session; Work on L hip mobs/distraction to increase ROM, STM to glute med, hip flexibility and functional strengthening/squats and lunges   Kristeen Sar, PT 09/04/23 9:40 AM

## 2023-09-21 ENCOUNTER — Other Ambulatory Visit: Payer: Self-pay | Admitting: Sports Medicine

## 2023-09-23 ENCOUNTER — Ambulatory Visit: Payer: Self-pay | Attending: Sports Medicine | Admitting: Physical Therapy

## 2023-09-23 ENCOUNTER — Encounter: Payer: Self-pay | Admitting: Physical Therapy

## 2023-09-23 DIAGNOSIS — M25652 Stiffness of left hip, not elsewhere classified: Secondary | ICD-10-CM | POA: Insufficient documentation

## 2023-09-23 DIAGNOSIS — M25552 Pain in left hip: Secondary | ICD-10-CM | POA: Insufficient documentation

## 2023-09-23 NOTE — Therapy (Signed)
 OUTPATIENT PHYSICAL THERAPY LOWER EXTREMITY TREATMENT   Patient Name: Sherri Rodriguez MRN: [id number] DOB:08-31-1965, 58 y.o., female Today's Date: 09/23/2023  END OF SESSION:  PT End of Session - 09/23/23 1152     Visit Number 3    Number of Visits 8    Date for PT Re-Evaluation 10/23/23    Authorization Type [employer]    PT Start Time 1152    PT Stop Time 1235    PT Time Calculation (min) 43 min    Activity Tolerance Patient tolerated treatment well    Behavior During Therapy [facility] for tasks assessed/performed            History reviewed. No pertinent past medical history. History reviewed. No pertinent surgical history. Patient Active Problem List   Diagnosis Date Noted   Primary osteoarthritis of right knee 07/20/2019    PCP: Cleotilde Planas, MD   REFERRING PROVIDER: Leonce Katz, DO   REFERRING DIAG:  [phone] (ICD-10-CM) - Chronic left hip pain  M16.12 (ICD-10-CM) - Primary osteoarthritis of left hip    THERAPY DIAG:  Pain in left hip  Stiffness of left hip, not elsewhere classified  Rationale for Evaluation and Treatment: Rehabilitation  ONSET DATE: months  SUBJECTIVE:   SUBJECTIVE STATEMENT: Exercises are going pretty good. Pain is at ant bone. I was running playing tennis Saturday and it caught.  I feel like I'm walking funny.  From Eval: In the morning the pain is the worse. Meloxicam  is helping.Feels in the groin and into ITB. Once I start moving it gets better.  Sit to stand hurts. Has to pause before walking. Stretching doesn't really help.  PERTINENT HISTORY: moderate femoral acetabular osteoarthritis  PAIN:  Are you having pain? Yes: NPRS scale: 2/10 Pain location: L groin and into ITB Pain description: catch/strain  Aggravating factors: sit to stand, initial walking, mornings Relieving factors: Meloxicam , moving  PRECAUTIONS: None  RED FLAGS: None   WEIGHT BEARING RESTRICTIONS: No  FALLS:  Has patient fallen in last  6 months? No  LIVING ENVIRONMENT: Stairs are no problem  OCCUPATION: Desk work  PLOF: Independent and Leisure: plays tennis (fine)  PATIENT GOALS: get past the flare up, strengthen the area  NEXT MD VISIT: end of July  OBJECTIVE:  Note: Objective measures were completed at Evaluation unless otherwise noted.  DIAGNOSTIC FINDINGS:  Moderate degenerative changes in the L femoral acetabular joint  PATIENT SURVEYS:  LEFS  Extreme difficulty/unable (0), Quite a bit of difficulty (1), Moderate difficulty (2), Little difficulty (3), No difficulty (4) Survey date:    Any of your usual work, housework or school activities 4  2. Usual hobbies, recreational or sporting activities 4  3. Getting into/out of the bath 3  4. Walking between rooms 4  5. Putting on socks/shoes 4  6. Squatting  4  7. Lifting an object, like a bag of groceries from the floor 4  8. Performing light activities around your home 4  9. Performing heavy activities around your home 4  10. Getting into/out of a car 2  11. Walking 2 blocks 4  12. Walking 1 mile 4  13. Going up/down 10 stairs (1 flight) 4  14. Standing for 1 hour 4  15.  sitting for 1 hour 3  16. Running on even ground 4  17. Running on uneven ground 4  18. Making sharp turns while running fast 4  19. Hopping  4  20. Rolling over in bed 3  Score total:  75  COGNITION: Overall cognitive status: Within functional limits for tasks assessed     SENSATION: WFL   MUSCLE LENGTH: B HS R>L, L hip flexor and pirformis tightness  POSTURE: No Significant postural limitations  PALPATION: L glute med,  LOWER EXTREMITY ROM:  Active ROM Right eval Left eval Left 09/23/23  Hip flexion 120 100 Equal to R  Hip extension     Hip abduction 38 40   Hip adduction     Hip internal rotation     Hip external rotation     Knee flexion     Knee extension     Ankle dorsiflexion     Ankle plantarflexion     Ankle inversion     Ankle eversion       (Blank rows = not tested)  LOWER EXTREMITY MMT: grossly 5/5 B hips; weakness in core with MMT to hip flexors  LOWER EXTREMITY SPECIAL TESTS:  Hip special tests: Belvie (FABER) test: positive  and FADIR positive  FUNCTIONAL TESTS:  5 times sit to stand: 9.32 seconds feels it in groin                                                                                                                              TREATMENT DATE:   09/23/2023 Bike L3 x 5 min PT present to discuss status Gait: assessed and pt has decreased stance time on L LE; did exaggerated step length down/back hallway, but no significant change in gait pattern after. Left hip ADDuctor stretch in 1/2 kneel position x 10 slow rock then hold x 20 One foot on mat with hip in ER, reach to floor x 30 sec ea Supine hip ADD stretch with strap B 2x30 sec Foam roller:B ITB and quads Manual LLD to L hip - pt reports relief  Knee flexion retested - full Hip flexor stretch in warrior position; also tried on stairs, but does not feel here.  09/04/2023 Supine hamstring stretch with green strap bilateral  Supine Quad Stretch bilateral 2 x 30 bilateral  Supine piriformis stretch 2 x 30 sec bilateral  Hooklying PPT 2 x 10 Hooklying TA activation + hip abduction with red loop 2 x 10 Hooklying TA activation + hip adduction ball squeeze 2 x 10 Supine Bridge 2 x 10 TA activation + SLR 2 x 10 bilateral  Sit to Stand with 7# DB  x 10  Clamshells with green TB x 10 bilateral  Reverse clamshell with green x 10 bilateral    08/28/23  See pt ed and HEP   PATIENT EDUCATION:  Education details: PT eval findings, anticipated POC, initial HEP, and long leg distraction for home   Person educated: Patient Education method: Explanation, Demonstration, Tactile cues, Verbal cues, and Handouts Education comprehension: verbalized understanding and returned demonstration  HOME EXERCISE PROGRAM: Access Code: [email or web address] URL:  [email or web address] Date: 09/23/2023 Prepared by: Mliss  Exercises - Supine Quadriceps Stretch with Strap on Table  - 2  x daily - 7 x weekly - 1 sets - 3 reps - 30 sec hold - Supine Hamstring Stretch with Strap  - 2 x daily - 7 x weekly - 1 sets - 3 reps - 30 sec hold - Supine Figure 4 Piriformis Stretch  - 2 x daily - 7 x weekly - 1 sets - 3 reps - 30 sec hold - Kneeling Adductor Stretch with Hip External Rotation  - 1 x daily - 7 x weekly - 1 sets - 3 reps - 30 sec hold - Supine Transversus Abdominis Bracing - Hands on Stomach  - 1 x daily - 7 x weekly - 2 sets - 10 reps - Hooklying Clamshell with Resistance  - 1 x daily - 7 x weekly - 2 sets - 10 reps - Supine Hip Adduction Isometric with Ball  - 1 x daily - 7 x weekly - 2 sets - 10 reps - Supine Pelvic Tilt with Straight Leg Raise  - 1 x daily - 7 x weekly - 2 sets - 10 reps - Supine Bridge  - 1 x daily - 7 x weekly - 2 sets - 10 reps - Supine 90/90 Alternating Heel Touches with Posterior Pelvic Tilt  - 1 x daily - 7 x weekly - 2 sets - 10 reps - Clamshell with Resistance  - 1 x daily - 7 x weekly - 2 sets - 10 reps - Sidelying Reverse Clamshell  - 1 x daily - 7 x weekly - 2 sets - 10 reps ASSESSMENT:  CLINICAL IMPRESSION: Patient reporting return of pain since coming off meloxicam . She has tightness in her hip ADDuctors and left hip flexor which we addressed today. She also has tightness in her quads and ITB which we addressed with the foam roller. Advised pt to do 1x/day. Her Left hip flexion has returned to normal and her pain is isolated to the L TFL region. She denies groin pain. She does demonstrate decreased stance time left with gait and would benefit from more SLS/glute med strengthening.   OBJECTIVE IMPAIRMENTS: decreased activity tolerance, difficulty walking, decreased ROM, decreased strength, increased muscle spasms, impaired flexibility, and pain.   ACTIVITY LIMITATIONS: sitting, squatting, bed mobility,  locomotion level, and getting in/out of car  PARTICIPATION LIMITATIONS: limited when pain present  PERSONAL FACTORS: 1-2 comorbidities: L hip OA and knee pain are also affecting patient's functional outcome.   REHAB POTENTIAL: Good  CLINICAL DECISION MAKING: Stable/uncomplicated  EVALUATION COMPLEXITY: Low   GOALS: Goals reviewed with patient? Yes  SHORT TERM GOALS: Target date: 09/25/2023   Patient will be independent with initial HEP. Baseline:  Goal status: INITIAL 09/23/23  2.  Decreased hip pain by 30% with car transfers and sit to stands Baseline:  Goal status: In progress for car. MET FOR sit to stands 09/23/23   LONG TERM GOALS: Target date: 10/23/2023   Patient will be independent with advanced/ongoing HEP to improve outcomes and carryover.  Baseline:  Goal status: INITIAL  2.  Patient will report decreased pain by 75% in the left hip with sit to stands, car transfers and ambulation. Baseline:  Goal status: INITIAL  3.  Patient will demonstrate improved left hip flexion to 110 deg to improve ability to squat lower and step onto higher surfaces. Baseline: 100 Goal status: INITIAL   PLAN:  PT FREQUENCY: 1x/week  PT DURATION: 8 weeks  PLANNED INTERVENTIONS: 97164- PT Re-evaluation, 97110-Therapeutic exercises, 97530- Therapeutic activity, [id number]- Neuromuscular re-education, 97535- Self Care, [id number]- Manual therapy,  [id number]- Electrical stimulation (unattended), 20560 (1-2 muscles), 20561 (3+ muscles)- Dry Needling, Patient/Family education, Joint mobilization, Spinal mobilization, Cryotherapy, and Moist heat  PLAN FOR NEXT SESSION: assess response to treatment session; Glute med strength L, STM to glute med, hip flexibility and functional strengthening/squats and lunges   Mliss Cummins, PT  09/23/23 2:04 PM

## 2023-09-28 ENCOUNTER — Ambulatory Visit: Payer: Self-pay | Admitting: Physical Therapy

## 2023-10-02 NOTE — Progress Notes (Signed)
    Ben Eriq Hufford D.[facility] [address] [place] [address] Phone: [phone]   Assessment and Plan:     1. Primary osteoarthritis of left hip 2. Chronic left hip pain  -Chronic with exacerbation, subsequent visit - Overall improvement in left hip pain consistent with improving flare of femoral acetabular osteoarthritis - Use Tylenol 500 to 1000 mg tablets 2-3 times a day for day-to-day pain relief - Use meloxicam  15 mg daily as needed for pain.  Recommend limiting chronic NSAIDs to 1-2 doses per week to prevent long-term side effects.  - Continue HEP and physical therapy for hip  Pertinent previous records reviewed include none    Follow Up: As needed if no improvement or worsening of symptoms.  Could consider repeat NSAID course versus prednisone  course versus CSI   Subjective:   I, Sherri Rodriguez, am serving as a Neurosurgeon for Doctor Morene Mace  Chief Complaint: left hip pain   HPI:    08/24/23 Patient is a 58 year old female with complaint of left hip pain. Patient states that the recent flare up started months ago. Pain doesn't not wake her up at night but when she wakes up in the morning the pain is present. Patient has tried PT, dry needling, and acupuncture for issue in the past. It did help.    Duration? years Did you have an Injury to cause this pain?no Taking Medication for pain?no Numbness or Tingling?yes along IT band Does the pain Radiate? yes Altered gait or use?yes ROM/ impairment of movement?yes   10/05/2023 Patient states she is feeling better. PT is helping    Relevant Historical Information: None pertinent  Additional pertinent review of systems negative.   Current Outpatient Medications:    baclofen  (LIORESAL ) 10 MG tablet, Take 1 tablet (10 mg total) by mouth 2 (two) times daily as needed for muscle spasms., Disp: 30 each, Rfl: 0   meloxicam  (MOBIC ) 15 MG tablet, Take 1 tablet (15 mg total) by mouth  daily., Disp: 30 tablet, Rfl: 0   Objective:     Vitals:   10/05/23 1306  BP: 108/78  Pulse: 61  SpO2: 100%  Weight: 132 lb (59.9 kg)  Height: 5' 3 (1.6 m)      Body mass index is 23.38 kg/m.    Physical Exam:    General: awake, alert, and oriented no acute distress, nontoxic Skin: no suspicious lesions or rashes Neuro:sensation intact distally with no deficits, normal muscle tone, no atrophy, strength 5/5 in all tested lower ext groups Psych: normal mood and affect, speech clear   Left hip: No deformity, swelling or wasting ROM Flexion 90, ext 30, IR 40, ER 45 NTTP over the hip flexors, greater trochanter, gluteal musculature, si joint, lumbar spine Negative log roll with FROM Positive FABER for mild groin pain Negative FADIR Negative Piriformis test Negative trendelenberg Gait normal left   Electronically signed by:  Odis Mace D.[facility] 1:18 PM 10/05/23

## 2023-10-05 ENCOUNTER — Ambulatory Visit: Admitting: Sports Medicine

## 2023-10-05 VITALS — BP 108/78 | HR 61 | Ht 63.0 in | Wt 132.0 lb

## 2023-10-05 DIAGNOSIS — M25552 Pain in left hip: Secondary | ICD-10-CM | POA: Diagnosis not present

## 2023-10-05 DIAGNOSIS — M1612 Unilateral primary osteoarthritis, left hip: Secondary | ICD-10-CM

## 2023-10-05 DIAGNOSIS — G8929 Other chronic pain: Secondary | ICD-10-CM | POA: Diagnosis not present

## 2023-10-05 NOTE — Patient Instructions (Signed)
 Tylenol [phone] mg 2-3 times a day for pain relief   - Use meloxicam  15 mg daily as needed for pain.  Recommend limiting chronic NSAIDs to 1-2 doses per week to prevent long-term side effects.  Continue HEP and PT   As needed follow up

## 2023-10-07 ENCOUNTER — Ambulatory Visit (HOSPITAL_BASED_OUTPATIENT_CLINIC_OR_DEPARTMENT_OTHER)
Admission: RE | Admit: 2023-10-07 | Discharge: 2023-10-07 | Disposition: A | Discharge status: Home / Self Care | Payer: Self-pay | Source: Ambulatory Visit | Attending: Pain Medicine | Admitting: Pain Medicine

## 2023-10-07 DIAGNOSIS — Z780 Asymptomatic menopausal state: Secondary | ICD-10-CM | POA: Diagnosis present

## 2023-10-08 NOTE — Therapy (Signed)
 OUTPATIENT PHYSICAL THERAPY LOWER EXTREMITY TREATMENT   Patient Name: Sherri Rodriguez MRN: [id number] DOB:Feb 01, 1966, 58 y.o., female Today's Date: 10/09/2023  END OF SESSION:  PT End of Session - 10/09/23 0936     Visit Number 4    Number of Visits 8    Date for PT Re-Evaluation 10/23/23    Authorization Type [employer]    PT Start Time [phone]    PT Stop Time 1016    PT Time Calculation (min) 40 min    Activity Tolerance Patient tolerated treatment well    Behavior During Therapy [facility] for tasks assessed/performed             History reviewed. No pertinent past medical history. History reviewed. No pertinent surgical history. Patient Active Problem List   Diagnosis Date Noted   Primary osteoarthritis of right knee 07/20/2019    PCP: Cleotilde Planas, MD   REFERRING PROVIDER: Leonce Katz, DO   REFERRING DIAG:  [phone] (ICD-10-CM) - Chronic left hip pain  M16.12 (ICD-10-CM) - Primary osteoarthritis of left hip    THERAPY DIAG:  Pain in left hip  Stiffness of left hip, not elsewhere classified  Rationale for Evaluation and Treatment: Rehabilitation  ONSET DATE: months  SUBJECTIVE:   SUBJECTIVE STATEMENT: Had bone scan. L hip -2.1 (-2.3 in the femoral neck) and R -1. Osteopenia. L hip pain comes and goes. Pain is solidly in the lateral joint now.   From Eval: In the morning the pain is the worse. Meloxicam  is helping.Feels in the groin and into ITB. Once I start moving it gets better.  Sit to stand hurts. Has to pause before walking. Stretching doesn't really help.  PERTINENT HISTORY: moderate femoral acetabular osteoarthritis  PAIN:  Are you having pain? Yes: NPRS scale: 2/10 Pain location: L groin and into ITB Pain description: catch/strain  Aggravating factors: sit to stand, initial walking, mornings Relieving factors: Meloxicam , moving  PRECAUTIONS: None  RED FLAGS: None   WEIGHT BEARING RESTRICTIONS: No  FALLS:  Has patient fallen in  last 6 months? No  LIVING ENVIRONMENT: Stairs are no problem  OCCUPATION: Desk work  PLOF: Independent and Leisure: plays tennis (fine)  PATIENT GOALS: get past the flare up, strengthen the area  NEXT MD VISIT: end of July  OBJECTIVE:  Note: Objective measures were completed at Evaluation unless otherwise noted.  DIAGNOSTIC FINDINGS:  Moderate degenerative changes in the L femoral acetabular joint  PATIENT SURVEYS:  LEFS  Extreme difficulty/unable (0), Quite a bit of difficulty (1), Moderate difficulty (2), Little difficulty (3), No difficulty (4) Survey date:    Any of your usual work, housework or school activities 4  2. Usual hobbies, recreational or sporting activities 4  3. Getting into/out of the bath 3  4. Walking between rooms 4  5. Putting on socks/shoes 4  6. Squatting  4  7. Lifting an object, like a bag of groceries from the floor 4  8. Performing light activities around your home 4  9. Performing heavy activities around your home 4  10. Getting into/out of a car 2  11. Walking 2 blocks 4  12. Walking 1 mile 4  13. Going up/down 10 stairs (1 flight) 4  14. Standing for 1 hour 4  15.  sitting for 1 hour 3  16. Running on even ground 4  17. Running on uneven ground 4  18. Making sharp turns while running fast 4  19. Hopping  4  20. Rolling over in bed 3  Score total:  75     COGNITION: Overall cognitive status: Within functional limits for tasks assessed     SENSATION: WFL   MUSCLE LENGTH: B HS R>L, L hip flexor and pirformis tightness  POSTURE: No Significant postural limitations  PALPATION: L glute med,  LOWER EXTREMITY ROM:  Active ROM Right eval Left eval Left 09/23/23  Hip flexion 120 100 Equal to R  Hip extension     Hip abduction 38 40   Hip adduction     Hip internal rotation     Hip external rotation     Knee flexion     Knee extension     Ankle dorsiflexion     Ankle plantarflexion     Ankle inversion     Ankle eversion       (Blank rows = not tested)  LOWER EXTREMITY MMT: grossly 5/5 B hips; weakness in core with MMT to hip flexors  LOWER EXTREMITY SPECIAL TESTS:  Hip special tests: Belvie (FABER) test: positive  and FADIR positive  FUNCTIONAL TESTS:  5 times sit to stand: 9.32 seconds feels it in groin                                                                                                                              TREATMENT DATE:   10/09/2023 Bike L4 x 5 min PT present to discuss status Left hip ADDuctor stretch in 1/2 kneel position x 10 slow rock then hold x 20 One foot on mat with hip in ER, reach to floor x 30 sec ea Hip hike standing 6 inch step x 20 Standing fire hydrant red loop  2x15 B Standing isometric hip ABD into ball at wall 10 x 10 sec Seated up and overs B using Teclor bar to fatigue B Self care: discussion of toe spacers for hallux valgus and second toe adduction correction, osteoporosis information  09/23/2023 Bike L3 x 5 min PT present to discuss status Gait: assessed and pt has decreased stance time on L LE; did exaggerated step length down/back hallway, but no significant change in gait pattern after. Left hip ADDuctor stretch in 1/2 kneel position x 10 slow rock then hold x 20 One foot on mat with hip in ER, reach to floor x 30 sec ea Supine hip ADD stretch with strap B 2x30 sec Foam roller:B ITB and quads Manual LLD to L hip - pt reports relief  Knee flexion retested - full Hip flexor stretch in warrior position; also tried on stairs, but does not feel here.  09/04/2023 Supine hamstring stretch with green strap bilateral  Supine Quad Stretch bilateral 2 x 30 bilateral  Supine piriformis stretch 2 x 30 sec bilateral  Hooklying PPT 2 x 10 Hooklying TA activation + hip abduction with red loop 2 x 10 Hooklying TA activation + hip adduction ball squeeze 2 x 10 Supine Bridge 2 x 10 TA activation + SLR 2 x 10 bilateral  Sit to Stand with 7# DB  x 10  Clamshells with  green TB x 10 bilateral  Reverse clamshell with green x 10 bilateral    08/28/23  See pt ed and HEP   PATIENT EDUCATION:  Education details: PT eval findings, anticipated POC, initial HEP, and long leg distraction for home   Person educated: Patient Education method: Explanation, Demonstration, Tactile cues, Verbal cues, and Handouts Education comprehension: verbalized understanding and returned demonstration  HOME EXERCISE PROGRAM: Access Code: [email or web address] URL: [email or web address] Date: 10/09/2023 Prepared by: Mliss  Exercises - Supine Quadriceps Stretch with Strap on Table  - 2 x daily - 7 x weekly - 1 sets - 3 reps - 30 sec hold - Supine Hamstring Stretch with Strap  - 2 x daily - 7 x weekly - 1 sets - 3 reps - 30 sec hold - Supine Figure 4 Piriformis Stretch  - 2 x daily - 7 x weekly - 1 sets - 3 reps - 30 sec hold - Kneeling Adductor Stretch with Hip External Rotation  - 1 x daily - 7 x weekly - 1 sets - 3 reps - 30 sec hold - Supine Transversus Abdominis Bracing - Hands on Stomach  - 1 x daily - 7 x weekly - 2 sets - 10 reps - Hooklying Clamshell with Resistance  - 1 x daily - 7 x weekly - 2 sets - 10 reps - Supine Hip Adduction Isometric with Ball  - 1 x daily - 7 x weekly - 2 sets - 10 reps - Supine Pelvic Tilt with Straight Leg Raise  - 1 x daily - 7 x weekly - 2 sets - 10 reps - Supine Bridge  - 1 x daily - 7 x weekly - 2 sets - 10 reps - Supine 90/90 Alternating Heel Touches with Posterior Pelvic Tilt  - 1 x daily - 7 x weekly - 2 sets - 10 reps - Clamshell with Resistance  - 1 x daily - 7 x weekly - 2 sets - 10 reps - Sidelying Reverse Clamshell  - 1 x daily - 7 x weekly - 2 sets - 10 reps - Seated March over Obstacle  - 1 x daily - 3 x weekly - 2 sets - 10 reps - Standing Hip Abduction with Bent Knee  - 1 x daily - 3 x weekly - 2 sets - 10 reps - Standing Isometric Hip Abduction with Ball on Wall  - 1 x daily - 3 x weekly - 2 sets - 10 reps  Program  Notes OSTEOPOROSIS  Significant research has been done over the last few years that have made great strides in osteoporosis recommendations.  A few key concepts about bone loading:1) be specific and target those areas of lowest T scores like the femoral neck or lumbar spine2) be progressive and patient. It takes 6-8 months to make a change in bones3)  the areas that have the worst T score benefit the most and the biggest improvements are seen!4)  Even if you stop exercise, there will be some decline although some benefits will maintain POWER LIFTING:One study called the LIFTMOR study really expands on the benefit of  lifting heavier loads:  dead lifting, carries  PROGRESSIVE RESISTANCE TRAINING: can be your own body weight against gravity, elastic bands or weights for resistance.  A basic muscle strengthening program can include:  ?Squat, lunge, hinge or bridge exercises to improve leg strength?Push, pull and press exercises for upper body and shoulder muscles such  as pull downs, rows and counter or floor push ups?Planks, side planks and bird dog exercises to target abdominal and back extensor muscles and improve posture?  IMPACT EXERCISE:  a study in 2019 measured ground reactive forces relative to bodyweight to find which exercises stimulated the bone the most.  Some of the most surprisingly high beneficial forces jmz:Yzzo dropsHoppingFoot StompingSide to side jump over rope  BALANCE:  balance exercises involve staying steady during movements that make you unstable. You should practice:?Leaning forward, backward or side to side?Unusual walking or dance patterns, such as walking heel to toe or sideways or using an agility ladder?Reacting to things that upset your balance, like stopping or changing directions?Tai Chi Practice exercises that improve functional abilities such as:?Sit to stands or squats?Stair climbing or toe taps on a step  Aim for 2-3 times per week and increase difficulty over time.    ASSESSMENT:  CLINICAL IMPRESSION: Patient reports overall improvement in L hip pain with sit to stands and walking. It is intermittent and has isolated to the left lateral hip. No pain in groin any longer. She reports only achiness today. She still has pain with car transfers, but has modified how she does them to avoid the pain. She tolerated seated up and over exercise today without pain, but left hip was weaker. Discomfort became less with more reps as well. We focused on L gluteus med strengthening today. Patient had bone density scan and she is close to osteoporosis in the left femoral neck, but presently diagnosed with osteopenia. OP information provided to patient. She also had concerns about her toes and info provided re: toe spacers/aligners. Patient will be on vacation until next visit. At that time we will determine whether to extend POC or D/C to HEP. Patient continues to demonstrate potential for improvement and would benefit from continued skilled therapy to address impairments.     OBJECTIVE IMPAIRMENTS: decreased activity tolerance, difficulty walking, decreased ROM, decreased strength, increased muscle spasms, impaired flexibility, and pain.   ACTIVITY LIMITATIONS: sitting, squatting, bed mobility, locomotion level, and getting in/out of car  PARTICIPATION LIMITATIONS: limited when pain present  PERSONAL FACTORS: 1-2 comorbidities: L hip OA and knee pain are also affecting patient's functional outcome.   REHAB POTENTIAL: Good  CLINICAL DECISION MAKING: Stable/uncomplicated  EVALUATION COMPLEXITY: Low   GOALS: Goals reviewed with patient? Yes  SHORT TERM GOALS: Target date: 09/25/2023   Patient will be independent with initial HEP. Baseline:  Goal status: MET 10/09/23  2.  Decreased hip pain by 30% with car transfers and sit to stands Baseline:  Goal status: In progress for car. MET FOR sit to stands 09/23/23   LONG TERM GOALS: Target date: 10/23/2023   Patient  will be independent with advanced/ongoing HEP to improve outcomes and carryover.  Baseline:  Goal status: INITIAL  2.  Patient will report decreased pain by 75% in the left hip with sit to stands, car transfers and ambulation. Baseline:  Goal status: INITIAL  3.  Patient will demonstrate improved left hip flexion to 110 deg to improve ability to squat lower and step onto higher surfaces. Baseline: 100 Goal status: MET   PLAN:  PT FREQUENCY: 1x/week  PT DURATION: 8 weeks  PLANNED INTERVENTIONS: 97164- PT Re-evaluation, 97110-Therapeutic exercises, 97530- Therapeutic activity, [id number]- Neuromuscular re-education, 97535- Self Care, [id number]- Manual therapy, [id number]- Electrical stimulation (unattended), 20560 (1-2 muscles), 20561 (3+ muscles)- Dry Needling, Patient/Family education, Joint mobilization, Spinal mobilization, Cryotherapy, and Moist heat  PLAN FOR NEXT SESSION: Recert or  d/c. Assess response to glute med strengthening.    Mliss Cummins, PT  10/09/23 10:36 AM

## 2023-10-09 ENCOUNTER — Encounter: Payer: Self-pay | Admitting: Physical Therapy

## 2023-10-09 ENCOUNTER — Ambulatory Visit: Payer: Self-pay | Attending: Sports Medicine | Admitting: Physical Therapy

## 2023-10-09 DIAGNOSIS — M25552 Pain in left hip: Secondary | ICD-10-CM | POA: Diagnosis present

## 2023-10-09 DIAGNOSIS — M25652 Stiffness of left hip, not elsewhere classified: Secondary | ICD-10-CM | POA: Insufficient documentation

## 2023-10-09 NOTE — Patient Instructions (Addendum)
 OSTEOPOROSIS   Significant research has been done over the last few years that have made great strides in osteoporosis recommendations.  A few key concepts about bone loading: 1) be specific and target those areas of lowest T scores like the femoral neck or lumbar spine 2) be progressive and patient. It takes 6-8 months to make a change in bones 3)  the areas that have the worst T score benefit the most and the biggest improvements are seen! 4)  Even if you stop exercise, there will be some decline although some benefits will maintain  POWER LIFTING: One study called the LIFTMOR study really expands on the benefit of  lifting heavier loads:  dead lifting, carries   PROGRESSIVE RESISTANCE TRAINING: can be your own body weight against gravity, elastic bands or weights for resistance.  A basic muscle strengthening program can include:   Squat, lunge, hinge or bridge exercises to improve leg strength Push, pull and press exercises for upper body and shoulder muscles such as pull downs, rows and counter or floor push ups Planks, side planks and bird dog exercises to target abdominal and back extensor muscles and improve posture   IMPACT EXERCISE:  a study in 2019 measured ground reactive forces relative to bodyweight to find which exercises stimulated the bone the most.  Some of the most surprisingly high beneficial forces are: Heel drops Hopping Foot Stomping Side to side jump over rope   BALANCE:  balance exercises involve staying steady during movements that make you unstable. You should practice: Leaning forward, backward or side to side Unusual walking or dance patterns, such as walking heel to toe or sideways or using an agility ladder Reacting to things that upset your balance, like stopping or changing directions Tai Chi  Practice exercises that improve functional abilities such as: Sit to  stands or squats Stair climbing or toe taps on a step   Aim for 2-3 times per week and increase difficulty over time.

## 2023-10-12 ENCOUNTER — Encounter: Payer: Self-pay | Admitting: Physical Therapy

## 2023-10-21 ENCOUNTER — Ambulatory Visit: Payer: Self-pay | Admitting: Physical Therapy
